# Patient Record
Sex: Female | Born: 1975 | Race: White | Hispanic: No | Marital: Married | Smoking: Current every day smoker
Health system: Southern US, Community
[De-identification: ages and names within clinical notes are randomized; demographics above are authoritative.]

## PROBLEM LIST (undated history)

## (undated) DIAGNOSIS — F319 Bipolar disorder, unspecified: Secondary | ICD-10-CM

## (undated) DIAGNOSIS — F410 Panic disorder [episodic paroxysmal anxiety] without agoraphobia: Secondary | ICD-10-CM

## (undated) DIAGNOSIS — G43909 Migraine, unspecified, not intractable, without status migrainosus: Secondary | ICD-10-CM

## (undated) DIAGNOSIS — R569 Unspecified convulsions: Secondary | ICD-10-CM

## (undated) DIAGNOSIS — F419 Anxiety disorder, unspecified: Secondary | ICD-10-CM

## (undated) HISTORY — PX: TUBAL LIGATION: SHX77

---

## 2011-10-06 ENCOUNTER — Encounter (HOSPITAL_BASED_OUTPATIENT_CLINIC_OR_DEPARTMENT_OTHER): Payer: Self-pay | Admitting: *Deleted

## 2011-10-06 ENCOUNTER — Emergency Department (HOSPITAL_BASED_OUTPATIENT_CLINIC_OR_DEPARTMENT_OTHER): Payer: Self-pay

## 2011-10-06 ENCOUNTER — Emergency Department (HOSPITAL_BASED_OUTPATIENT_CLINIC_OR_DEPARTMENT_OTHER)
Admission: EM | Admit: 2011-10-06 | Discharge: 2011-10-06 | Disposition: A | Discharge status: Home / Self Care | Payer: Self-pay | Attending: Emergency Medicine | Admitting: Emergency Medicine

## 2011-10-06 DIAGNOSIS — J3489 Other specified disorders of nose and nasal sinuses: Secondary | ICD-10-CM | POA: Insufficient documentation

## 2011-10-06 DIAGNOSIS — R220 Localized swelling, mass and lump, head: Secondary | ICD-10-CM | POA: Insufficient documentation

## 2011-10-06 DIAGNOSIS — IMO0002 Reserved for concepts with insufficient information to code with codable children: Secondary | ICD-10-CM | POA: Insufficient documentation

## 2011-10-06 DIAGNOSIS — S02401A Maxillary fracture, unspecified, initial encounter for closed fracture: Secondary | ICD-10-CM | POA: Insufficient documentation

## 2011-10-06 DIAGNOSIS — F17200 Nicotine dependence, unspecified, uncomplicated: Secondary | ICD-10-CM | POA: Insufficient documentation

## 2011-10-06 DIAGNOSIS — S022XXA Fracture of nasal bones, initial encounter for closed fracture: Secondary | ICD-10-CM | POA: Insufficient documentation

## 2011-10-06 MED ORDER — HYDROCODONE-ACETAMINOPHEN 5-325 MG PO TABS: 2.0000 | ORAL_TABLET | ORAL | Status: AC | PRN

## 2011-10-06 MED ORDER — PROMETHAZINE HCL 25 MG PO TABS
25.0000 mg | ORAL_TABLET | Freq: Once | ORAL | Status: AC
  Administered 2011-10-06: 25 mg via ORAL
  Filled 2011-10-06: qty 1

## 2011-10-06 MED ORDER — HYDROCODONE-ACETAMINOPHEN 5-325 MG PO TABS
2.0000 | ORAL_TABLET | Freq: Once | ORAL | Status: AC
  Administered 2011-10-06: 2 via ORAL
  Filled 2011-10-06: qty 2
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
## 2011-10-06 NOTE — ED Provider Notes (Signed)
History     CSN: [id number]  Arrival date & time 10/06/11  1109   First MD Initiated Contact with Patient 10/06/11 1237      Chief Complaint  Patient presents with  . Facial Injury    (Consider location/radiation/quality/duration/timing/severity/associated sxs/prior treatment) Patient is a 36 y.o. female presenting with facial injury. The history is provided by the patient. No language interpreter was used.  Facial Injury  The incident occurred today. The injury mechanism was a direct blow. It is unknown if the wounds were self-inflicted. No protective equipment was used. The pain is moderate. It is unlikely that a foreign body is present. There have been no prior injuries to these areas. Her tetanus status is UTD. There were sick contacts at home.  Pt reports she hit her head on the corner of a table a week ago  No past medical history on file.  No past surgical history on file.  No family history on file.  History  Substance Use Topics  . Smoking status: Current Every Day Smoker  . Smokeless tobacco: Not on file  . Alcohol Use:     OB History    Grav Para Term Preterm Abortions TAB SAB Ect Mult Living                  Review of Systems  HENT: Positive for facial swelling. Negative for nosebleeds.   All other systems reviewed and are negative.    Allergies  Darvocet and Toradol  Home Medications  No current outpatient prescriptions on file.  BP 110/68  Pulse 92  Temp 98.5 F (36.9 C) (Oral)  Resp 20  SpO2 100%  Physical Exam  Nursing note and vitals reviewed. Constitutional: She appears well-developed and well-nourished.  HENT:  Head: Normocephalic and atraumatic.       Tender nose,  Tender left maxillary sinuses  Eyes: Conjunctivae normal and EOM are normal. Pupils are equal, round, and reactive to light.  Neck: Normal range of motion. Neck supple.  Cardiovascular: Normal rate and regular rhythm.   Pulmonary/Chest: Effort normal and breath sounds  normal.  Musculoskeletal: Normal range of motion.  Neurological: She is alert.  Skin: Skin is warm.  Psychiatric: She has a normal mood and affect.    ED Course  Procedures (including critical care time)  Labs Reviewed - No data to display No results found.   1. Nasal bone fracture   2. Maxillary sinus fracture     No results found for this or any previous visit. Ct Maxillofacial Wo Cm  10/06/2011  *RADIOLOGY REPORT*  Clinical Data: Fall with facial injury.  Left periorbital bruising.  CT MAXILLOFACIAL WITHOUT CONTRAST  Technique:  Multidetector CT imaging of the maxillofacial structures was performed. Multiplanar CT image reconstructions were also generated.  Comparison: None.  Findings: Left nasal fractures displaced by 1 mm. Mild impaction of the anterior margin of a 10 mm segment of the left maxillary sinus noted.  The zygomatic arch appears intact, as do the inferior orbital rim, orbital floor, and lateral orbital wall.  The pterygoid plates appear intact.  No mandibular fracture observed.  A localized collection of gas is noted in the fatty tissues between the left buccinator and left masseter muscles.  No discrete intraorbital abnormality observed.  IMPRESSION:  1.  Minimally displaced left nasal fracture. 2.  Segmental impaction of the left anterior maxillary sinus wall. 3.  Localized collection of gas in the fatty tissues between the left buccinator and left masseter muscles.  Original Report Authenticated By: Dellia Cloud, M.D.      MDM  Follow up with Dr. Suszanne Conners next week.   Ice,  Rx for hydrocodone for pain.         Lonia Skinner Coleman, [place] 10/06/11 1421

## 2011-10-06 NOTE — ED Provider Notes (Signed)
Medical screening examination/treatment/procedure(s) were performed by non-physician practitioner and as supervising physician I was immediately available for consultation/collaboration.   Carleene Cooper III, MD 10/06/11 1901

## 2012-05-14 ENCOUNTER — Encounter (HOSPITAL_COMMUNITY): Payer: Self-pay | Admitting: *Deleted

## 2012-05-14 ENCOUNTER — Emergency Department (HOSPITAL_COMMUNITY)
Admission: EM | Admit: 2012-05-14 | Discharge: 2012-05-15 | Disposition: A | Discharge status: Home / Self Care | Payer: Self-pay | Attending: Emergency Medicine | Admitting: Emergency Medicine

## 2012-05-14 DIAGNOSIS — Z8659 Personal history of other mental and behavioral disorders: Secondary | ICD-10-CM | POA: Insufficient documentation

## 2012-05-14 DIAGNOSIS — Z79899 Other long term (current) drug therapy: Secondary | ICD-10-CM | POA: Insufficient documentation

## 2012-05-14 DIAGNOSIS — R112 Nausea with vomiting, unspecified: Secondary | ICD-10-CM | POA: Insufficient documentation

## 2012-05-14 DIAGNOSIS — G43909 Migraine, unspecified, not intractable, without status migrainosus: Secondary | ICD-10-CM | POA: Insufficient documentation

## 2012-05-14 DIAGNOSIS — Z8669 Personal history of other diseases of the nervous system and sense organs: Secondary | ICD-10-CM | POA: Insufficient documentation

## 2012-05-14 DIAGNOSIS — F17200 Nicotine dependence, unspecified, uncomplicated: Secondary | ICD-10-CM | POA: Insufficient documentation

## 2012-05-14 HISTORY — DX: Migraine, unspecified, not intractable, without status migrainosus: G43.909

## 2012-05-14 HISTORY — DX: Anxiety disorder, unspecified: F41.9

## 2012-05-14 HISTORY — DX: Bipolar disorder, unspecified: F31.9

## 2012-05-14 HISTORY — DX: Unspecified convulsions: R56.9

## 2012-05-14 HISTORY — DX: Panic disorder (episodic paroxysmal anxiety): F41.0

## 2012-05-14 MED ORDER — DEXAMETHASONE SODIUM PHOSPHATE 10 MG/ML IJ SOLN
10.0000 mg | Freq: Once | INTRAMUSCULAR | Status: AC
  Administered 2012-05-14: 10 mg via INTRAMUSCULAR
  Filled 2012-05-14: qty 1

## 2012-05-14 MED ORDER — METOCLOPRAMIDE HCL 5 MG/ML IJ SOLN
10.0000 mg | Freq: Once | INTRAMUSCULAR | Status: AC
  Administered 2012-05-14: 10 mg via INTRAMUSCULAR
  Filled 2012-05-14: qty 2

## 2012-05-14 MED ORDER — DIPHENHYDRAMINE HCL 50 MG/ML IJ SOLN
25.0000 mg | Freq: Once | INTRAMUSCULAR | Status: AC
  Administered 2012-05-14: 25 mg via INTRAMUSCULAR
  Filled 2012-05-14: qty 1

## 2012-05-14 MED ORDER — HYDROCODONE-ACETAMINOPHEN 5-325 MG PO TABS
2.0000 | ORAL_TABLET | Freq: Once | ORAL | Status: AC
  Administered 2012-05-14: 2 via ORAL
  Filled 2012-05-14: qty 2

## 2012-05-14 NOTE — ED Notes (Signed)
Pt states has had a migraine since Monday morning, hx of migraines, light and sound sensitivity, nausea and vomited x 1 tonight after dinner.

## 2012-05-14 NOTE — ED Provider Notes (Signed)
History     CSN: [id number]  Arrival date & time 05/14/12  2208   First MD Initiated Contact with Patient 05/14/12 2258      Chief Complaint  Patient presents with  . Migraine    (Consider location/radiation/quality/duration/timing/severity/associated sxs/prior treatment) Patient is a 37 y.o. female presenting with migraines. The history is provided by the patient and medical records. No language interpreter was used.  Migraine This is a new problem. The current episode started in the past 7 days. The problem occurs constantly. The problem has been unchanged. Associated symptoms include headaches, nausea and vomiting. Pertinent negatives include no abdominal pain, anorexia, arthralgias, change in bowel habit, chest pain, chills, congestion, coughing, diaphoresis, fatigue, fever, joint swelling, myalgias, neck pain, numbness, rash, sore throat, swollen glands, urinary symptoms, vertigo, visual change or weakness. The symptoms are aggravated by coughing. She has tried NSAIDs for the symptoms. The treatment provided no relief.    Nera Haworth is a 37 y.o. female  with a hx of migraine, bipolar, anxiety presents to the Emergency Department complaining of gradual, persistent, progressively worsening headache onset 3 days ago.  Pt has tried goodies powder and tylenol without relief. Associated symptoms include nausea and vomiting, facial pain.  Nothing makes it better and light, sound makes it worse.  Pt denies fever, chills, chest pain, SOB, abdominal pain, diarrhea, weakness, dizziness, syncope. Pt also denies sick contacts, nasal congestion, rhinorrhea, sneezing, cough, chest congestion.     Past Medical History  Diagnosis Date  . Migraine   . Bipolar 1 disorder   . Anxiety   . Panic attacks   . Seizures     Past Surgical History  Procedure Laterality Date  . Tubal ligation      History reviewed. No pertinent family history.  History  Substance Use Topics  . Smoking status:  Current Every Day Smoker  . Smokeless tobacco: Never Used  . Alcohol Use: No    OB History   Grav Para Term Preterm Abortions TAB SAB Ect Mult Living                  Review of Systems  Constitutional: Negative for fever, chills, diaphoresis, appetite change, fatigue and unexpected weight change.  HENT: Negative for congestion, sore throat, mouth sores, neck pain and neck stiffness.   Eyes: Negative for visual disturbance.  Respiratory: Negative for cough, chest tightness, shortness of breath and wheezing.   Cardiovascular: Negative for chest pain.  Gastrointestinal: Positive for nausea and vomiting. Negative for abdominal pain, diarrhea, constipation, anorexia and change in bowel habit.  Endocrine: Negative for polydipsia, polyphagia and polyuria.  Genitourinary: Negative for dysuria, urgency, frequency and hematuria.  Musculoskeletal: Negative for myalgias, back pain, joint swelling and arthralgias.  Skin: Negative for rash.  Allergic/Immunologic: Negative for immunocompromised state.  Neurological: Positive for headaches. Negative for vertigo, syncope, weakness, light-headedness and numbness.  Hematological: Does not bruise/bleed easily.  Psychiatric/Behavioral: Negative for sleep disturbance. The patient is not nervous/anxious.     Allergies  Darvocet and Toradol  Home Medications   Current Outpatient Rx  Name  Route  Sig  Dispense  Refill  . Aspirin-Acetaminophen-Caffeine (GOODY HEADACHE PO)   Oral   Take 1 packet by mouth daily as needed. Migraines           BP 119/65  Pulse 55  Temp(Src) 98.1 F (36.7 C) (Oral)  Resp 16  Ht 5\' 4"  (1.626 m)  Wt 120 lb (54.432 kg)  BMI 20.59 kg/m2  SpO2  98%  LMP 04/29/2012  Physical Exam  Nursing note and vitals reviewed. Constitutional: She is oriented to person, place, and time. She appears well-developed and well-nourished. No distress.  HENT:  Head: Normocephalic and atraumatic.  Mouth/Throat: Oropharynx is clear  and moist.  Eyes: Conjunctivae and EOM are normal. Pupils are equal, round, and reactive to light. No scleral icterus.  Neck: Normal range of motion. Neck supple.  Cardiovascular: Normal rate, regular rhythm, normal heart sounds and intact distal pulses.   No murmur heard. Pulmonary/Chest: Effort normal and breath sounds normal. No respiratory distress. She has no wheezes. She has no rales.  Abdominal: Soft. Bowel sounds are normal. There is no tenderness. There is no rebound and no guarding.  Musculoskeletal: Normal range of motion.  Lymphadenopathy:    She has no cervical adenopathy.  Neurological: She is alert and oriented to person, place, and time. She has normal reflexes. No cranial nerve deficit. She exhibits normal muscle tone. Coordination normal.  Speech is clear and goal oriented, follows commands Cranial nerves III - XII without deficit, no facial droop Normal strength in upper and lower extremities bilaterally, strong and equal grip strength Sensation normal to light and sharp touch Moves extremities without ataxia, coordination intact Normal finger to nose and rapid alternating movements Neg romberg, no pronator drift Normal gait Normal heel-shin and balance   Skin: Skin is warm and dry. No rash noted. She is not diaphoretic.  Psychiatric: She has a normal mood and affect. Her behavior is normal. Judgment and thought content normal.    ED Course  Procedures (including critical care time)  Labs Reviewed - No data to display No results found.   1. Migraine       MDM  [name] presents with headache and hx of migraine.  Pt HA treated and improved while in ED.  Presentation is like pts typical HA and non concerning for [facility], ICH, Meningitis, or temporal arteritis. Pt is afebrile with no focal neuro deficits, nuchal rigidity, or change in vision. Pt is to follow up with PCP to discuss prophylactic medication. Pt verbalizes understanding and is agreeable with  plan to dc.          Dahlia Client Aashka Salomone, PA-C 05/15/12 0045

## 2012-05-15 NOTE — ED Provider Notes (Signed)
  Medical screening examination/treatment/procedure(s) were performed by non-physician practitioner and as supervising physician I was immediately available for consultation/collaboration.   Ryun Velez, MD 05/15/12 1928 

## 2012-11-18 ENCOUNTER — Encounter (HOSPITAL_COMMUNITY): Payer: Self-pay | Admitting: Emergency Medicine

## 2012-11-18 ENCOUNTER — Emergency Department (HOSPITAL_COMMUNITY)
Admission: EM | Admit: 2012-11-18 | Discharge: 2012-11-18 | Discharge status: Against Medical Advice | Payer: Self-pay | Attending: Emergency Medicine | Admitting: Emergency Medicine

## 2012-11-18 DIAGNOSIS — G43909 Migraine, unspecified, not intractable, without status migrainosus: Secondary | ICD-10-CM | POA: Insufficient documentation

## 2012-11-18 DIAGNOSIS — F319 Bipolar disorder, unspecified: Secondary | ICD-10-CM | POA: Insufficient documentation

## 2012-11-18 DIAGNOSIS — Z8669 Personal history of other diseases of the nervous system and sense organs: Secondary | ICD-10-CM | POA: Insufficient documentation

## 2012-11-18 DIAGNOSIS — F17200 Nicotine dependence, unspecified, uncomplicated: Secondary | ICD-10-CM | POA: Insufficient documentation

## 2012-11-18 DIAGNOSIS — F410 Panic disorder [episodic paroxysmal anxiety] without agoraphobia: Secondary | ICD-10-CM | POA: Insufficient documentation

## 2012-11-18 DIAGNOSIS — R209 Unspecified disturbances of skin sensation: Secondary | ICD-10-CM | POA: Insufficient documentation

## 2012-11-18 DIAGNOSIS — Z3202 Encounter for pregnancy test, result negative: Secondary | ICD-10-CM | POA: Insufficient documentation

## 2012-11-18 DIAGNOSIS — R064 Hyperventilation: Secondary | ICD-10-CM | POA: Insufficient documentation

## 2012-11-18 DIAGNOSIS — R000 Tachycardia, unspecified: Secondary | ICD-10-CM | POA: Insufficient documentation

## 2012-11-18 LAB — COMPREHENSIVE METABOLIC PANEL
ALT: 9 U/L (ref 0–35)
AST: 18 U/L (ref 0–37)
CO2: 21 mEq/L (ref 19–32)
Chloride: 109 mEq/L (ref 96–112)
GFR calc non Af Amer: >90 mL/min (ref >90)
Sodium: 140 mEq/L (ref 135–145)
Total Bilirubin: 0.2 mg/dL — ABNORMAL LOW (ref 0.3–1.2)

## 2012-11-18 LAB — URINALYSIS, ROUTINE W REFLEX MICROSCOPIC
Bilirubin Urine: NEGATIVE
Glucose, UA: NEGATIVE mg/dL
Ketones, ur: NEGATIVE mg/dL
Leukocytes, UA: NEGATIVE
Protein, ur: NEGATIVE mg/dL

## 2012-11-18 LAB — RAPID URINE DRUG SCREEN, HOSP PERFORMED
Amphetamines: NOT DETECTED
Barbiturates: NOT DETECTED
Benzodiazepines: NOT DETECTED

## 2012-11-18 LAB — CBC
Platelets: 239 10*3/uL (ref 150–400)
RBC: 4.14 MIL/uL (ref 3.87–5.11)
WBC: 9.4 10*3/uL (ref 4.0–10.5)

## 2012-11-18 LAB — ACETAMINOPHEN LEVEL: Acetaminophen (Tylenol), Serum: <15 ug/mL (ref 10–30)

## 2012-11-18 MED ORDER — ALPRAZOLAM 0.5 MG PO TABS
0.5000 mg | ORAL_TABLET | Freq: Once | ORAL | Status: AC
  Administered 2012-11-18: 0.5 mg via ORAL
  Filled 2012-11-18: qty 1

## 2012-11-18 MED ORDER — NICOTINE 14 MG/24HR TD PT24
14.0000 mg | MEDICATED_PATCH | Freq: Once | TRANSDERMAL | Status: DC
  Administered 2012-11-18: 14 mg via TRANSDERMAL
  Filled 2012-11-18: qty 1

## 2012-11-18 NOTE — Progress Notes (Signed)
[facility] provided pt with a [employer] Orange Card application, highlighting [facility].  °

## 2012-11-18 NOTE — ED Notes (Signed)
Pt expressed feelings to leave because she was not getting help at the moment. Talked to TTS about pt and told pt they would be in to talk to her in about 30 mins. Pt did not want to wait for team to come in and pt left AMA. No items left in Rm and pt not in dept.

## 2012-11-18 NOTE — ED Notes (Signed)
Brought in by EMS from home with c/o "manic attack". Pt called EMS and reported that she feels she is having a panic or manic attack--- reports that she has Bipolar Disorder and she feels that she can not "calm herself down and needs to be evaluated".  Pt reports that she is off her psych medication for "3 years".
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
## 2012-11-18 NOTE — ED Notes (Signed)
Bed: HY86 Expected date:  Expected time:  Means of arrival:  Comments: EMS/hyperventilating/manic

## 2012-11-18 NOTE — ED Notes (Signed)
TTS (Assessment) was notified at this time.  Pt was informed that TTS staff will see her on 1st shift, per TTS.

## 2012-11-19 NOTE — ED Provider Notes (Signed)
Medical screening examination/treatment/procedure(s) were performed by non-physician practitioner and as supervising physician I was immediately available for consultation/collaboration.    Olivia Mackie, MD 11/19/12 [phone]

## 2013-02-28 ENCOUNTER — Emergency Department (HOSPITAL_COMMUNITY)
Admission: EM | Admit: 2013-02-28 | Discharge: 2013-02-28 | Disposition: A | Discharge status: Home / Self Care | Payer: Self-pay | Attending: Emergency Medicine | Admitting: Emergency Medicine

## 2013-02-28 ENCOUNTER — Encounter (HOSPITAL_COMMUNITY): Payer: Self-pay | Admitting: Emergency Medicine

## 2013-02-28 ENCOUNTER — Emergency Department (HOSPITAL_COMMUNITY): Payer: Self-pay

## 2013-02-28 DIAGNOSIS — S060XAA Concussion with loss of consciousness status unknown, initial encounter: Secondary | ICD-10-CM

## 2013-02-28 DIAGNOSIS — S060X9A Concussion with loss of consciousness of unspecified duration, initial encounter: Secondary | ICD-10-CM

## 2013-02-28 DIAGNOSIS — S060X0A Concussion without loss of consciousness, initial encounter: Secondary | ICD-10-CM | POA: Insufficient documentation

## 2013-02-28 DIAGNOSIS — Z8669 Personal history of other diseases of the nervous system and sense organs: Secondary | ICD-10-CM | POA: Insufficient documentation

## 2013-02-28 DIAGNOSIS — Z8679 Personal history of other diseases of the circulatory system: Secondary | ICD-10-CM | POA: Insufficient documentation

## 2013-02-28 DIAGNOSIS — F17200 Nicotine dependence, unspecified, uncomplicated: Secondary | ICD-10-CM | POA: Insufficient documentation

## 2013-02-28 DIAGNOSIS — Z8659 Personal history of other mental and behavioral disorders: Secondary | ICD-10-CM | POA: Insufficient documentation

## 2013-02-28 MED ORDER — ONDANSETRON 4 MG PO TBDP
4.0000 mg | ORAL_TABLET | Freq: Once | ORAL | Status: AC
  Administered 2013-02-28: 4 mg via ORAL
  Filled 2013-02-28: qty 1

## 2013-02-28 MED ORDER — HYDROCODONE-ACETAMINOPHEN 5-325 MG PO TABS
1.0000 | ORAL_TABLET | Freq: Once | ORAL | Status: AC
  Administered 2013-02-28: 1 via ORAL
  Filled 2013-02-28: qty 1

## 2013-02-28 MED ORDER — HYDROCODONE-ACETAMINOPHEN 5-325 MG PO TABS: 1.0000 | ORAL_TABLET | ORAL | Status: DC | PRN
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
## 2013-02-28 NOTE — Discharge Instructions (Signed)
Head Injury, Adult  You have received a head injury. It does not appear serious at this time. Headaches and vomiting are common following head injury. It should be easy to awaken from sleeping. Sometimes it is necessary for you to stay in the emergency department for a while for observation. Sometimes admission to the hospital may be needed. After injuries such as yours, most problems occur within the first 24 hours, but side effects may occur up to 7 10 days after the injury. It is important for you to carefully monitor your condition and contact your health care provider or seek immediate medical care if there is a change in your condition.  WHAT ARE THE TYPES OF HEAD INJURIES?  Head injuries can be as minor as a bump. Some head injuries can be more severe. More severe head injuries include:  · A jarring injury to the brain (concussion).  · A bruise of the brain (contusion). This mean there is bleeding in the brain that can cause swelling.  · A cracked skull (skull fracture).  · Bleeding in the brain that collects, clots, and forms a bump (hematoma).  WHAT CAUSES A HEAD INJURY?  A serious head injury is most likely to happen to someone who is in a car wreck and is not wearing a seat belt. Other causes of major head injuries include bicycle or motorcycle accidents, sports injuries, and falls.  HOW ARE HEAD INJURIES DIAGNOSED?  A complete history of the event leading to the injury and your current symptoms will be helpful in diagnosing head injuries. Many times, pictures of the brain, such as CT or MRI are needed to see the extent of the injury. Often, an overnight hospital stay is necessary for observation.   WHEN SHOULD I SEEK IMMEDIATE MEDICAL CARE?   You should get help right away if:  · You have confusion or drowsiness.  · You feel sick to your stomach (nauseous) or have continued, forceful vomiting.  · You have dizziness or unsteadiness that is getting worse.  · You have severe, continued headaches not  relieved by medicine. Only take over-the-counter or prescription medicines for pain, fever, or discomfort as directed by your health care provider.  · You do not have normal function of the arms or legs or are unable to walk.  · You notice changes in the black spots in the center of the colored part of your eye (pupil).  · You have a clear or bloody fluid coming from your nose or ears.  · You have a loss of vision.  During the next 24 hours after the injury, you must stay with someone who can watch you for the warning signs. This person should contact local emergency services (911 in the [place] if you have seizures, you become unconscious, or you are unable to wake up.  HOW CAN I PREVENT A HEAD INJURY IN THE FUTURE?  The most important factor for preventing major head injuries is avoiding motor vehicle accidents.  To minimize the potential for damage to your head, it is crucial to wear seat belts while riding in motor vehicles. Wearing helmets while bike riding and playing collision sports (like football) is also helpful. Also, avoiding dangerous activities around the house will further help reduce your risk of head injury.   WHEN CAN I RETURN TO NORMAL ACTIVITIES AND ATHLETICS?  You should be reevaluated by your health care provider before returning to these activities. If you have any of the following symptoms, you should not return   to activities or contact sports until 1 week after the symptoms have stopped:  · Persistent headache.  · Dizziness or vertigo.  · Poor attention and concentration.  · Confusion.  · Memory problems.  · Nausea or vomiting.  · Fatigue or tire easily.  · Irritability.  · Intolerant of bright lights or loud noises.  · Anxiety or depression.  · Disturbed sleep.  MAKE SURE YOU:   · Understand these instructions.  · Will watch your condition.  · Will get help right away if you are not doing well or get worse.  Document Released: 12/18/2004 Document Revised: 10/08/2012 Document Reviewed:  08/25/2012  ExitCare® Patient Information ©2014 [employer].

## 2014-09-24 ENCOUNTER — Emergency Department (HOSPITAL_COMMUNITY)
Admission: EM | Admit: 2014-09-24 | Discharge: 2014-09-25 | Disposition: A | Discharge status: Home / Self Care | Payer: Self-pay | Attending: Emergency Medicine | Admitting: Emergency Medicine

## 2014-09-24 ENCOUNTER — Encounter (HOSPITAL_COMMUNITY): Payer: Self-pay | Admitting: Emergency Medicine

## 2014-09-24 DIAGNOSIS — T43222A Poisoning by selective serotonin reuptake inhibitors, intentional self-harm, initial encounter: Secondary | ICD-10-CM

## 2014-09-24 DIAGNOSIS — F1210 Cannabis abuse, uncomplicated: Secondary | ICD-10-CM | POA: Insufficient documentation

## 2014-09-24 DIAGNOSIS — T50901A Poisoning by unspecified drugs, medicaments and biological substances, accidental (unintentional), initial encounter: Secondary | ICD-10-CM | POA: Diagnosis present

## 2014-09-24 DIAGNOSIS — T1491 Suicide attempt: Secondary | ICD-10-CM

## 2014-09-24 DIAGNOSIS — T426X2A Poisoning by other antiepileptic and sedative-hypnotic drugs, intentional self-harm, initial encounter: Secondary | ICD-10-CM

## 2014-09-24 DIAGNOSIS — X58XXXA Exposure to other specified factors, initial encounter: Secondary | ICD-10-CM | POA: Insufficient documentation

## 2014-09-24 DIAGNOSIS — Z3202 Encounter for pregnancy test, result negative: Secondary | ICD-10-CM | POA: Insufficient documentation

## 2014-09-24 DIAGNOSIS — Y9289 Other specified places as the place of occurrence of the external cause: Secondary | ICD-10-CM | POA: Insufficient documentation

## 2014-09-24 DIAGNOSIS — Z720 Tobacco use: Secondary | ICD-10-CM | POA: Insufficient documentation

## 2014-09-24 DIAGNOSIS — T43592A Poisoning by other antipsychotics and neuroleptics, intentional self-harm, initial encounter: Secondary | ICD-10-CM

## 2014-09-24 DIAGNOSIS — G43909 Migraine, unspecified, not intractable, without status migrainosus: Secondary | ICD-10-CM | POA: Insufficient documentation

## 2014-09-24 DIAGNOSIS — F3130 Bipolar disorder, current episode depressed, mild or moderate severity, unspecified: Secondary | ICD-10-CM | POA: Diagnosis present

## 2014-09-24 DIAGNOSIS — Y998 Other external cause status: Secondary | ICD-10-CM | POA: Insufficient documentation

## 2014-09-24 DIAGNOSIS — Y9389 Activity, other specified: Secondary | ICD-10-CM | POA: Insufficient documentation

## 2014-09-24 DIAGNOSIS — F3132 Bipolar disorder, current episode depressed, moderate: Secondary | ICD-10-CM

## 2014-09-24 LAB — RAPID URINE DRUG SCREEN, HOSP PERFORMED
Amphetamines: NOT DETECTED
Barbiturates: NOT DETECTED
Benzodiazepines: NOT DETECTED
Cocaine: NOT DETECTED
OPIATES: NOT DETECTED
TETRAHYDROCANNABINOL: POSITIVE — AB

## 2014-09-24 LAB — COMPREHENSIVE METABOLIC PANEL
ALK PHOS: 92 U/L (ref 38–126)
ALT: 14 U/L (ref 14–54)
AST: 26 U/L (ref 15–41)
Albumin: 3.9 g/dL (ref 3.5–5.0)
Anion gap: 8 (ref 5–15)
BUN: 10 mg/dL (ref 6–20)
CALCIUM: 9.1 mg/dL (ref 8.9–10.3)
CO2: 23 mmol/L (ref 22–32)
CREATININE: 0.9 mg/dL (ref 0.44–1.00)
Chloride: 107 mmol/L (ref 101–111)
Glucose, Bld: 96 mg/dL (ref 65–99)
Potassium: 3.8 mmol/L (ref 3.5–5.1)
Sodium: 138 mmol/L (ref 135–145)
TOTAL PROTEIN: 7.4 g/dL (ref 6.5–8.1)
Total Bilirubin: 0.4 mg/dL (ref 0.3–1.2)

## 2014-09-24 LAB — ACETAMINOPHEN LEVEL: Acetaminophen (Tylenol), Serum: <10 ug/mL — ABNORMAL LOW (ref 10–30)

## 2014-09-24 LAB — CBC
HCT: 34.5 % — ABNORMAL LOW (ref 36.0–46.0)
HEMOGLOBIN: 11.3 g/dL — AB (ref 12.0–15.0)
MCH: 28.6 pg (ref 26.0–34.0)
MCHC: 32.8 g/dL (ref 30.0–36.0)
MCV: 87.3 fL (ref 78.0–100.0)
PLATELETS: 247 10*3/uL (ref 150–400)
RBC: 3.95 MIL/uL (ref 3.87–5.11)
RDW: 14.4 % (ref 11.5–15.5)
WBC: 6.7 10*3/uL (ref 4.0–10.5)

## 2014-09-24 LAB — ETHANOL

## 2014-09-24 LAB — SALICYLATE LEVEL

## 2014-09-24 LAB — CBG MONITORING, ED: Glucose-Capillary: 98 mg/dL (ref 65–99)

## 2014-09-24 LAB — PREGNANCY, URINE: PREG TEST UR: NEGATIVE

## 2014-09-24 MED ORDER — ALUM & MAG HYDROXIDE-SIMETH 200-200-20 MG/5ML PO SUSP: 30.0000 mL | ORAL | Status: DC | PRN

## 2014-09-24 MED ORDER — ONDANSETRON HCL 4 MG PO TABS: 4.0000 mg | ORAL_TABLET | Freq: Three times a day (TID) | ORAL | Status: DC | PRN

## 2014-09-24 MED ORDER — NICOTINE 21 MG/24HR TD PT24
21.0000 mg | MEDICATED_PATCH | Freq: Every day | TRANSDERMAL | Status: DC
  Administered 2014-09-25: 21 mg via TRANSDERMAL
  Filled 2014-09-24: qty 1

## 2014-09-24 MED ORDER — LORAZEPAM 1 MG PO TABS: 1.0000 mg | ORAL_TABLET | Freq: Three times a day (TID) | ORAL | Status: DC | PRN

## 2014-09-24 NOTE — Progress Notes (Signed)
CM spoke with pt who confirms uninsured [employer] resident with no pcp. Pt in [facility] ED with [facility] staff  CM discussed and provided written information for uninsured accepting pcps, discussed the importance of pcp vs EDP services for f/u care, www.needymeds.org, www.goodrx.com, discounted pharmacies and other [employer] such as [facility] , [employer], affordable care act, financial assistance, uninsured dental services, [place] med assist, DSS and  health department  Reviewed resources for [employer] uninsured accepting pcps like [facility], family medicine at [facility], [facility], [facility], local urgent care centers, [facility], [facility] family practice, general medical clinics, family services of the [place], [facility] urgent care plus others, medication resources, [facility] out patient pharmacies and housing Pt voiced understanding and appreciation of resources provided   Provided [facility] contact information Pt did not agreed to a referral at this time Incarcerated sees psychiatrist Dr Joanne Chars

## 2014-09-24 NOTE — ED Notes (Signed)
Pt. Noted sleeping in room. No complaints or concerns voiced. No distress or abnormal behavior noted. Will continue to monitor with security cameras. Q 15 minute rounds continue. 

## 2014-09-24 NOTE — ED Notes (Signed)
Pt. Noted in room. No complaints or concerns voiced. No distress or abnormal behavior noted. Will continue to monitor with security cameras. Q 15 minute rounds continue. 

## 2014-09-24 NOTE — ED Notes (Signed)
Pt oriented to room and unit.  Denies SI, HI and AVH.   Pt reassured of her safety.  Video monitoring and 15 minute checks in place.

## 2014-09-24 NOTE — ED Notes (Signed)
Bed: [facility] Expected date:  Expected time:  Means of arrival:  Comments: Hold for rm 2

## 2014-09-24 NOTE — Consult Note (Signed)
[facility] Face-to-Face Psychiatry Consult   Reason for Consult:  Overdose Referring Physician:  EDP Patient Identification: Kaitlyn Shepherd MRN:  [id number] Principal Diagnosis: Bipolar affective disorder, depressed Diagnosis:   Patient Active Problem List   Diagnosis Date Noted  . Bipolar affective disorder, depressed [F31.30] 09/24/2014    Priority: High  . Overdose [T50.901A] 09/24/2014    Priority: High    Total Time spent with patient: 45 minutes  Subjective:   Katheleen Shepherd is a 39 y.o. female patient admitted with intentional overdose.  HPI:  39 yo female presented to the ED after an overdose on 10 Celexa, 4-5 gabapentin, and 2-300 mg Seroquel this morning.  History of bipolar disorder, patient at [facility].  Patient was upset last night after an altercation with her adult son and mother who do not want anything to do with her because they are accusing her of using "crack".  Patient denies this allegation.  This morning she felt she would "do everyone a favor and leave this world" and overdosed, texted her mother and son with apologies for being a bad mother.  911 was eventually called.  Alizzon lives with her fiance which is going well.  Currently, denies suicidal/homicidal ideations, hallucinations, and drug abuse except marijuana on occasion.  No prior suicide attempts and one psychiatric hospitalization "years ago".  Denies homicidal ideations and hallucinations. HPI Elements:   Location:  generalized. Quality:  acute. Severity:  moderate to severe. Timing:  intermittent. Duration:  since last night. Context:  altercation with family.  Past Medical History:  Past Medical History  Diagnosis Date  . Migraine   . Bipolar 1 disorder   . Anxiety   . Panic attacks   . Seizures     Past Surgical History  Procedure Laterality Date  . Tubal ligation     Family History: History reviewed. No pertinent family history. Social History:  History  Alcohol Use No     History   Drug Use  . Yes  . Special: Marijuana    Social History   Social History  . Marital Status: Legally Separated    Spouse Name: N/A  . Number of Children: N/A  . Years of Education: N/A   Social History Main Topics  . Smoking status: Current Every Day Smoker  . Smokeless tobacco: Never Used  . Alcohol Use: No  . Drug Use: Yes    Special: Marijuana  . Sexual Activity: Not Asked   Other Topics Concern  . None   Social History Narrative   Additional Social History:                          Allergies:   Allergies  Allergen Reactions  . Darvocet [Propoxyphene N-Acetaminophen] Itching  . Toradol [Ketorolac Tromethamine] Rash    Itching     Labs:  Results for orders placed or performed during the hospital encounter of 09/24/14 (from the past 48 hour(s))  CBG monitoring, ED     Status: None   Collection Time: 09/24/14  3:58 PM  Result Value Ref Range   Glucose-Capillary 98 65 - 99 mg/dL    Vitals: Blood pressure 118/66, pulse 89, temperature 98.6 F (37 C), temperature source Oral, resp. rate 18, last menstrual period 01/08/2013, SpO2 96 %.  Risk to Self: Is patient at risk for suicide?: Yes Risk to Others:   Prior Inpatient Therapy:   Prior Outpatient Therapy:    Current Facility-Administered Medications  Medication Dose Route Frequency  Provider Last Rate Last Dose  . alum & mag hydroxide-simeth (MAALOX/MYLANTA) 200-200-20 MG/5ML suspension 30 mL  30 mL Oral PRN Azalia Bilis, MD      . nicotine (NICODERM CQ - dosed in mg/24 hours) patch 21 mg  21 mg Transdermal Daily Azalia Bilis, MD      . ondansetron [facility]) tablet 4 mg  4 mg Oral Q8H PRN Azalia Bilis, MD       Current Outpatient Prescriptions  Medication Sig Dispense Refill  . citalopram (CELEXA) 40 MG tablet Take 40 mg by mouth daily.    Marland Kitchen gabapentin (NEURONTIN) 300 MG capsule Take 300 mg by mouth 2 (two) times daily as needed (pain).    . QUEtiapine (SEROQUEL XR) 300 MG 24 hr tablet Take 300 mg  by mouth at bedtime.    Marland Kitchen HYDROcodone-acetaminophen (NORCO/VICODIN) 5-325 MG per tablet Take 1-2 tablets by mouth every 4 (four) hours as needed. (Patient not taking: Reported on 09/24/2014) 16 tablet 0    Musculoskeletal: Strength & Muscle Tone: within normal limits Gait & Station: normal Patient leans: N/A  Psychiatric Specialty Exam: Physical Exam  Review of Systems  Constitutional: Negative.   HENT: Negative.   Eyes: Negative.   Respiratory: Negative.   Cardiovascular: Negative.   Gastrointestinal: Negative.   Genitourinary: Negative.   Musculoskeletal: Negative.   Skin: Negative.   Neurological: Negative.   Endo/Heme/Allergies: Negative.   Psychiatric/Behavioral: Positive for depression and substance abuse.    Blood pressure 118/66, pulse 89, temperature 98.6 F (37 C), temperature source Oral, resp. rate 18, last menstrual period 01/08/2013, SpO2 96 %.There is no weight on file to calculate BMI.  General Appearance: Disheveled  Eye Solicitor::  Fair  Speech:  Normal Rate  Volume:  Normal  Mood:  Depressed  Affect:  Congruent  Thought Process:  Coherent  Orientation:  Full (Time, Place, and Person)  Thought Content:  WDL  Suicidal Thoughts:  No  Homicidal Thoughts:  No  Memory:  Immediate;   Good Recent;   Good Remote;   Good  Judgement:  Poor  Insight:  Fair  Psychomotor Activity:  Decreased  Concentration:  Fair  Recall:  [employer] of Knowledge:Good  Language: Fair  Akathisia:  No  Handed:  Right  AIMS (if indicated):     Assets:  Housing Intimacy Leisure Time Physical Health Resilience Social Support  ADL's:  Intact  Cognition: WNL  Sleep:      Medical Decision Making: Review of Psycho-Social Stressors (1), Review or order clinical lab tests (1) and Review of Medication Regimen & Side Effects (2)  Treatment Plan Summary: Daily contact with patient to assess and evaluate symptoms and progress in treatment, Medication management and Plan bipolar  affective disorder most recent episode depressed, moderate: -Crisis stabilization -Medication management:  Wait to restart home medications due to overdose -Individual counseling  Plan:  Recommend psychiatric Inpatient admission when medically cleared. Disposition: Admit to inpatient hospitalization for stabilization  Nanine Means, PMH-NP 09/24/2014 4:34 PM Patient seen face-to-face for psychiatric evaluation, chart reviewed and case discussed with the physician extender and developed treatment plan. Reviewed the information documented and agree with the treatment plan. Thedore Mins, MD

## 2014-09-24 NOTE — ED Notes (Signed)
Report received from Eddie RN. Pt. Sleeping, respirations regular and unlabored.  Will continue to monitor for safety via security cameras and Q 15 minute checks.  

## 2014-09-24 NOTE — ED Provider Notes (Signed)
CSN: [id number]     Arrival date & time 09/24/14  1538 History   First MD Initiated Contact with Patient 09/24/14 1550     Chief Complaint  Patient presents with  . IVC    . Suicidal  . Drug Overdose     HPI Patient reports intentional ingestion at 12:15 PM today.  She reports that she ingested celexa x 5, seroquel  x 2,and  neurontin  x 5.  She reports she did this in attempt to kill her self.  Right now her only complaint is feeling "sleepy".  Denies chest pain shortness breath.  Denies ibuprofen or Tylenol use.  Reports increasing issues between multiple family members which is what drove her to these thoughts today.  She is normally compliant with her medications and is followed by a psychiatrist at [facility].  She has a history of bipolar disorder.    Past Medical History  Diagnosis Date  . Migraine   . Bipolar 1 disorder   . Anxiety   . Panic attacks   . Seizures    Past Surgical History  Procedure Laterality Date  . Tubal ligation     History reviewed. No pertinent family history. Social History  Substance Use Topics  . Smoking status: Current Every Day Smoker  . Smokeless tobacco: Never Used  . Alcohol Use: No   OB History    No data available     Review of Systems  All other systems reviewed and are negative.     Allergies  Darvocet and Toradol  Home Medications   Prior to Admission medications   Medication Sig Start Date End Date Taking? Authorizing Provider  Aspirin-Acetaminophen-Caffeine (GOODY HEADACHE PO) Take 2 packets by mouth 4 (four) times daily as needed (pain).     Historical Provider, MD  HYDROcodone-acetaminophen (NORCO/VICODIN) 5-325 MG per tablet Take 1-2 tablets by mouth every 4 (four) hours as needed. 02/28/13   Linwood Dibbles, MD   BP 109/69 mmHg  Pulse 95  Temp(Src) 99 F (37.2 C) (Oral)  Resp 20  SpO2 98%  LMP 01/08/2013 Physical Exam  Constitutional: She is oriented to person, place, and time. She appears well-developed and  well-nourished. No distress.  HENT:  Head: Normocephalic and atraumatic.  Eyes: EOM are normal.  Neck: Normal range of motion.  Cardiovascular: Normal rate, regular rhythm and normal heart sounds.   Pulmonary/Chest: Effort normal and breath sounds normal.  Abdominal: Soft. She exhibits no distension. There is no tenderness.  Musculoskeletal: Normal range of motion.  Neurological: She is alert and oriented to person, place, and time.  Skin: Skin is warm and dry.  Psychiatric: She has a normal mood and affect. Judgment normal.  Nursing note and vitals reviewed.   ED Course  Procedures (including critical care time) Labs Review Labs Reviewed  COMPREHENSIVE METABOLIC PANEL  ETHANOL  SALICYLATE LEVEL  ACETAMINOPHEN LEVEL  CBC  URINE RAPID DRUG SCREEN, HOSP PERFORMED  CBG MONITORING, ED    Imaging Review No results found. I have personally reviewed and evaluated these images and lab results as part of my medical decision-making.   EKG Interpretation   Date/Time:  Friday September 24 2014 16:08:11 EDT Ventricular Rate:  83 PR Interval:  135 QRS Duration: 96 QT Interval:  413 QTC Calculation: 485 R Axis:   102 Text Interpretation:  Sinus rhythm Probable lateral infarct, old No old  tracing to compare Confirmed by CAMPOS  MD, KEVIN ([id number]) on 09/24/2014  4:15:40 PM  MDM   Final diagnoses:  None   No life-threatening overdose.  Patient is medically clear at this time.  TTS to evaluate.    Azalia Bilis, MD 09/24/14 [phone]

## 2014-09-24 NOTE — ED Notes (Signed)
Pt brought in by [facility] paperwork in process. Reports taking "at least more than 5 Celexa, four to five 300 mg Gabapentin, two 300 mg Seroquel today around 1215" with the intention of self harm. Adamantly denies SI/HI at this time. Says, "I've been accused of smoking crack by my son. My family has turned their backs on me. I tried to see my son last night but I'm not allowed to be on my mother's property." Denies hallucinations. Pt appears very sleepy but is still able to answer all questions appropriately. No other c/c.

## 2014-09-25 DIAGNOSIS — F3132 Bipolar disorder, current episode depressed, moderate: Secondary | ICD-10-CM | POA: Insufficient documentation

## 2014-09-25 NOTE — ED Notes (Signed)
Pt. Requesting nicotine patch.

## 2014-09-25 NOTE — ED Notes (Signed)
Pt. Noted sleeping in room. No complaints or concerns voiced. No distress or abnormal behavior noted. Will continue to monitor with security cameras. Q 15 minute rounds continue. 

## 2014-09-25 NOTE — ED Notes (Signed)
Pt is aware that she is being dc'd.  Pt denies si/hi/avh at this time and reports that she feels safe to go home.  Up to the phone to call for a ride home.

## 2014-09-25 NOTE — Progress Notes (Signed)
10:31am. CSW called pt's finace, Kaitlyn Shepherd, at [phone]. Kaitlyn Shepherd reports that last night they met with pt's mother and son, and mother son and pt got into a verbal altercation. Kaitlyn Shepherd and pt are in the process of moving to [place] from [place], and Kaitlyn Shepherd reports that pt was hoping to have a good relationship with her mother and be able to see her more. Thus, when mother "basically said she doesn't want anything to do with [pt]," on top of stress of moving, pt became very upset and took overdose of her prescribed medicine. Kaitlyn Shepherd states that he loves pt and has been with her for 3 years, so he knows her triggers pretty well and knows how to help her. Kaitlyn Shepherd reports pt adheres to her medication regimen. Kaitlyn Shepherd reports that pt goes to [facility] in [place], and when she previously lived in [place] she also used [facility] for mental health services. Pt planning on transferring care back to [place]. Kaitlyn Shepherd states only long standing complaint is that pt oversleeps (10-12 hrs/day) and stays up late until about 1am and then wakes up as late as 1pm.   Kaitlyn Shepherd in agreement with discharge decision. CSW shared this information with psych team.  [facility] phone: [phone]

## 2014-09-25 NOTE — ED Notes (Addendum)
Nad, resting quietly, asking about her dc status.  Will verify w/ tts
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
## 2014-09-25 NOTE — [facility] Suicide Risk Assessment (Signed)
Suicide Risk Assessment  Discharge Assessment   [facility] Discharge Suicide Risk Assessment   Demographic Factors:  Caucasian  Total Time spent with patient: 30 minutes    Musculoskeletal: Strength & Muscle Tone: within normal limits Gait & Station: normal Patient leans: N/A  Psychiatric Specialty Exam: Physical Exam  Review of Systems  Constitutional: Negative.   HENT: Negative.   Eyes: Negative.   Respiratory: Negative.   Cardiovascular: Negative.   Gastrointestinal: Negative.   Genitourinary: Negative.   Musculoskeletal: Negative.   Skin: Negative.   Neurological: Negative.   Endo/Heme/Allergies: Negative.   Psychiatric/Behavioral: Positive for substance abuse.    Blood pressure 101/59, pulse 66, temperature 98.6 F (37 C), temperature source Oral, resp. rate 18, last menstrual period 01/08/2013, SpO2 95 %.There is no weight on file to calculate BMI.  General Appearance: Casual  Eye Contact::  Good  Speech:  Normal Rate  Volume:  Normal  Mood:  Euthymic  Affect:  Congruent  Thought Process:  Coherent  Orientation:  Full (Time, Place, and Person)  Thought Content:  WDL  Suicidal Thoughts:  No  Homicidal Thoughts:  No  Memory:  Immediate;   Good Recent;   Good Remote;   Good  Judgement:  Fair  Insight:  Good  Psychomotor Activity:  Normal  Concentration:  Good  Recall:  Good  Fund of Knowledge:Good  Language: Good  Akathisia:  No  Handed:  Right  AIMS (if indicated):     Assets:  Housing Intimacy Leisure Time Physical Health Resilience Social Support  ADL's:  Intact  Cognition: WNL  Sleep:      Has this patient used any form of tobacco in the last 30 days? (Cigarettes, Smokeless Tobacco, Cigars, and/or Pipes) Yes, A prescription for an FDA-approved tobacco cessation medication was offered at discharge and the patient refused  Mental Status Per Nursing Assessment::   On Admission:   Overdose  Current Mental Status by Physician: NA  Loss  Factors: NA  Historical Factors: NA  Risk Reduction Factors:   Sense of responsibility to family, Living with another person, especially a relative, Positive social support and Positive therapeutic relationship  Continued Clinical Symptoms:  NOne  Cognitive Features That Contribute To Risk:  None    Suicide Risk:  Minimal: No identifiable suicidal ideation.  Patients presenting with no risk factors but with morbid ruminations; may be classified as minimal risk based on the severity of the depressive symptoms  Principal Problem: Bipolar affective disorder, depressed Discharge Diagnoses:  Patient Active Problem List   Diagnosis Date Noted  . Bipolar affective disorder, depressed [F31.30] 09/24/2014    Priority: High  . Overdose [T50.901A] 09/24/2014    Priority: High  . Bipolar affective disorder, currently depressed, moderate [F31.32]     Follow-up Information    Follow up with Please use the resources provided to you in emergency room by case manager to assist with doctor for follow up . Schedule an appointment as soon as possible for a visit on 09/27/2014.   Contact information:   These [place] uninsured resources provide possible primary care providers, resources for discounted medications, housing, dental resources, affordable care act information, plus other resources for [employer] Of Care/Follow-up recommendations:  Activity:  as tolerated Diet:  heart healthy diet  Is patient on multiple antipsychotic therapies at discharge:  No   Has Patient had three or more failed trials of antipsychotic monotherapy by history:  No  Recommended Plan for  Multiple Antipsychotic Therapies: NA    Steward Sames, PMH-NP 09/25/2014, 10:39 AM

## 2014-09-25 NOTE — ED Notes (Addendum)
Written dc instructions reviewed w/ pt.  Pt denies si/hi/avh at this time and reports that she is safe to leave.  Pt encouraged to follow up with her MD and take her medications as directed (starting tomorrow). Pt encouraged to seek treatment for any suicidal thoughts/urges.  Pt ambulatory w/o difficulty to dc window w/ mHt, belongings returned after leaving the unit.

## 2014-09-25 NOTE — ED Notes (Signed)
Pt. Noted in room. No complaints or concerns voiced. No distress or abnormal behavior noted. Will continue to monitor with security cameras. Q 15 minute rounds continue. 

## 2014-09-25 NOTE — ED Notes (Addendum)
IVC has been rescinded per np/tts and is OK to dc home

## 2014-09-25 NOTE — ED Notes (Signed)
Dr a and jamison dnp into see 

## 2014-12-19 ENCOUNTER — Emergency Department (HOSPITAL_COMMUNITY)
Admission: EM | Admit: 2014-12-19 | Discharge: 2014-12-19 | Disposition: A | Discharge status: Home / Self Care | Payer: Self-pay

## 2014-12-19 ENCOUNTER — Encounter (HOSPITAL_COMMUNITY): Payer: Self-pay | Admitting: Oncology

## 2014-12-19 DIAGNOSIS — F1010 Alcohol abuse, uncomplicated: Secondary | ICD-10-CM | POA: Diagnosis present

## 2014-12-19 DIAGNOSIS — R451 Restlessness and agitation: Secondary | ICD-10-CM | POA: Insufficient documentation

## 2014-12-19 DIAGNOSIS — R45851 Suicidal ideations: Secondary | ICD-10-CM | POA: Insufficient documentation

## 2014-12-19 DIAGNOSIS — F410 Panic disorder [episodic paroxysmal anxiety] without agoraphobia: Secondary | ICD-10-CM | POA: Insufficient documentation

## 2014-12-19 DIAGNOSIS — S50311A Abrasion of right elbow, initial encounter: Secondary | ICD-10-CM | POA: Insufficient documentation

## 2014-12-19 DIAGNOSIS — F10929 Alcohol use, unspecified with intoxication, unspecified: Secondary | ICD-10-CM | POA: Insufficient documentation

## 2014-12-19 DIAGNOSIS — F319 Bipolar disorder, unspecified: Secondary | ICD-10-CM | POA: Insufficient documentation

## 2014-12-19 DIAGNOSIS — F10920 Alcohol use, unspecified with intoxication, uncomplicated: Secondary | ICD-10-CM

## 2014-12-19 DIAGNOSIS — F1410 Cocaine abuse, uncomplicated: Secondary | ICD-10-CM | POA: Diagnosis present

## 2014-12-19 DIAGNOSIS — F1210 Cannabis abuse, uncomplicated: Secondary | ICD-10-CM | POA: Insufficient documentation

## 2014-12-19 DIAGNOSIS — Y9289 Other specified places as the place of occurrence of the external cause: Secondary | ICD-10-CM | POA: Insufficient documentation

## 2014-12-19 DIAGNOSIS — Z79899 Other long term (current) drug therapy: Secondary | ICD-10-CM | POA: Insufficient documentation

## 2014-12-19 DIAGNOSIS — Y998 Other external cause status: Secondary | ICD-10-CM | POA: Insufficient documentation

## 2014-12-19 DIAGNOSIS — Y9389 Activity, other specified: Secondary | ICD-10-CM | POA: Insufficient documentation

## 2014-12-19 DIAGNOSIS — Z23 Encounter for immunization: Secondary | ICD-10-CM | POA: Insufficient documentation

## 2014-12-19 DIAGNOSIS — F17200 Nicotine dependence, unspecified, uncomplicated: Secondary | ICD-10-CM | POA: Insufficient documentation

## 2014-12-19 DIAGNOSIS — F1014 Alcohol abuse with alcohol-induced mood disorder: Secondary | ICD-10-CM | POA: Insufficient documentation

## 2014-12-19 DIAGNOSIS — Z3202 Encounter for pregnancy test, result negative: Secondary | ICD-10-CM | POA: Insufficient documentation

## 2014-12-19 DIAGNOSIS — F10120 Alcohol abuse with intoxication, uncomplicated: Secondary | ICD-10-CM | POA: Insufficient documentation

## 2014-12-19 DIAGNOSIS — F1094 Alcohol use, unspecified with alcohol-induced mood disorder: Secondary | ICD-10-CM | POA: Diagnosis present

## 2014-12-19 DIAGNOSIS — G43909 Migraine, unspecified, not intractable, without status migrainosus: Secondary | ICD-10-CM | POA: Insufficient documentation

## 2014-12-19 LAB — COMPREHENSIVE METABOLIC PANEL
ALT: 13 U/L — AB (ref 14–54)
AST: 25 U/L (ref 15–41)
Albumin: 4.4 g/dL (ref 3.5–5.0)
Alkaline Phosphatase: 83 U/L (ref 38–126)
Anion gap: 8 (ref 5–15)
CHLORIDE: 102 mmol/L (ref 101–111)
CO2: 24 mmol/L (ref 22–32)
CREATININE: 0.59 mg/dL (ref 0.44–1.00)
Calcium: 9.1 mg/dL (ref 8.9–10.3)
GFR calc Af Amer: >60 mL/min (ref >60)
GLUCOSE: 118 mg/dL — AB (ref 65–99)
Potassium: 3.7 mmol/L (ref 3.5–5.1)
Sodium: 134 mmol/L — ABNORMAL LOW (ref 135–145)
Total Bilirubin: 0.3 mg/dL (ref 0.3–1.2)
Total Protein: 7.4 g/dL (ref 6.5–8.1)

## 2014-12-19 LAB — CBC WITH DIFFERENTIAL/PLATELET
Basophils Absolute: 0 10*3/uL (ref 0.0–0.1)
Basophils Relative: 0 %
EOS PCT: 0 %
Eosinophils Absolute: 0 10*3/uL (ref 0.0–0.7)
HCT: 38.7 % (ref 36.0–46.0)
Hemoglobin: 12.9 g/dL (ref 12.0–15.0)
LYMPHS PCT: 29 %
Lymphs Abs: 2.8 10*3/uL (ref 0.7–4.0)
MCH: 28.7 pg (ref 26.0–34.0)
MCHC: 33.3 g/dL (ref 30.0–36.0)
MCV: 86 fL (ref 78.0–100.0)
MONO ABS: 0.6 10*3/uL (ref 0.1–1.0)
MONOS PCT: 6 %
Neutro Abs: 6.4 10*3/uL (ref 1.7–7.7)
Neutrophils Relative %: 65 %
PLATELETS: 284 10*3/uL (ref 150–400)
RBC: 4.5 MIL/uL (ref 3.87–5.11)
RDW: 15.1 % (ref 11.5–15.5)
WBC: 9.9 10*3/uL (ref 4.0–10.5)

## 2014-12-19 LAB — RAPID URINE DRUG SCREEN, HOSP PERFORMED
AMPHETAMINES: NOT DETECTED
Barbiturates: NOT DETECTED
Benzodiazepines: NOT DETECTED
Cocaine: POSITIVE — AB
Opiates: NOT DETECTED
Tetrahydrocannabinol: POSITIVE — AB

## 2014-12-19 LAB — PREGNANCY, URINE: PREG TEST UR: NEGATIVE

## 2014-12-19 LAB — ETHANOL: ALCOHOL ETHYL (B): 133 mg/dL — AB (ref <5)

## 2014-12-19 MED ORDER — ONDANSETRON HCL 4 MG PO TABS: 4.0000 mg | ORAL_TABLET | Freq: Three times a day (TID) | ORAL | Status: DC | PRN

## 2014-12-19 MED ORDER — ALPRAZOLAM 0.5 MG PO TABS
0.5000 mg | ORAL_TABLET | Freq: Once | ORAL | Status: AC
  Administered 2014-12-19: 0.5 mg via ORAL
  Filled 2014-12-19: qty 1

## 2014-12-19 MED ORDER — NICOTINE 21 MG/24HR TD PT24
21.0000 mg | MEDICATED_PATCH | Freq: Every day | TRANSDERMAL | Status: DC
  Administered 2014-12-19: 21 mg via TRANSDERMAL
  Filled 2014-12-19: qty 1

## 2014-12-19 MED ORDER — TETANUS-DIPHTH-ACELL PERTUSSIS 5-2.5-18.5 LF-MCG/0.5 IM SUSP
0.5000 mL | Freq: Once | INTRAMUSCULAR | Status: AC
  Administered 2014-12-19: 0.5 mL via INTRAMUSCULAR
  Filled 2014-12-19: qty 0.5

## 2014-12-19 MED ORDER — LORAZEPAM 1 MG PO TABS: 1.0000 mg | ORAL_TABLET | Freq: Three times a day (TID) | ORAL | Status: DC | PRN
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
## 2014-12-19 NOTE — Discharge Instructions (Signed)
Please follow up with [facility] or [facility]  for [facility] Health Care needs.  Contact [facility] Mobile Crisis Management if you are in crisis. Mobile Crisis provides response 24 hours a day, 7 days a week, 365 days a year.  [facility] ACCESS LINE:  [phone] or [phone] [facility] [address] [place], [place] [address] [email or web address]   Mobile Crisis Teams [facility]  Mobile Crisis Care Unit  [phone]    [facility]  [facility] Call for location of [facility] office  [address] [phone]      [place] [address]        [phone]

## 2014-12-19 NOTE — ED Notes (Signed)
Bed: WBH37 Expected date:  Expected time:  Means of arrival:  Comments: t4 

## 2014-12-19 NOTE — [facility] Suicide Risk Assessment (Signed)
Suicide Risk Assessment  Discharge Assessment   [facility]   Demographic Factors:  Caucasian  Total Time spent with patient: 45 minutes  Musculoskeletal: Strength & Muscle Tone: within normal limits Gait & Station: normal Patient leans: N/A  Psychiatric Specialty Exam: Review of Systems  Constitutional: Negative.   HENT: Negative.   Eyes: Negative.   Respiratory: Negative.   Cardiovascular: Negative.   Gastrointestinal: Negative.   Genitourinary: Negative.   Musculoskeletal: Negative.   Skin: Negative.   Neurological: Negative.   Endo/Heme/Allergies: Negative.   Psychiatric/Behavioral: Positive for substance abuse.    Blood pressure 107/83, pulse 72, temperature 97.6 F (36.4 C), temperature source Oral, resp. rate 16, height 5\' 4"  (1.626 m), weight 59.421 kg (131 lb), last menstrual period 01/08/2013, SpO2 98 %.Body mass index is 22.47 kg/(m^2).  General Appearance: Disheveled  Eye Contact::  Good  Speech:  Normal Rate  Volume:  Normal  Mood:  Anxious, mild  Affect:  Congruent  Thought Process:  Coherent  Orientation:  Full (Time, Place, and Person)  Thought Content:  WDL  Suicidal Thoughts:  No  Homicidal Thoughts:  No  Memory:  Immediate;   Good Recent;   Good Remote;   Good  Judgement:  Fair  Insight:  Fair  Psychomotor Activity:  Normal  Concentration:  Good  Recall:  Good  Fund of Knowledge:Good  Language: Good  Akathisia:  No  Handed:  Right  AIMS (if indicated):     Assets:  Housing Intimacy Leisure Time Physical Health Resilience Social Support  ADL's:  Intact  Cognition: WNL  Sleep:          Has this patient used any form of tobacco in the last 30 days? (Cigarettes, Smokeless Tobacco, Cigars, and/or Pipes) Yes, A prescription for an FDA-approved tobacco cessation medication was offered at discharge and the patient refused  Mental Status Per Nursing Assessment::   On Admission:   Substance abuse, suicidal  comment  Current Mental Status by Physician: NA  Loss Factors: NA  Historical Factors: Impulsivity  Risk Reduction Factors:   Sense of responsibility to family, Living with another person, especially a relative, Positive social support and Positive coping skills or problem solving skills  Continued Clinical Symptoms:  Anxiety, mild  Cognitive Features That Contribute To Risk:  None    Suicide Risk:  Minimal: No identifiable suicidal ideation.  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 Cocaine abuse [F14.10] 12/19/2014    Priority: High  . Alcohol abuse [F10.10] 12/19/2014    Priority: High  . Alcohol-induced mood disorder (HCC) [F10.94] 12/19/2014    Priority: High  . Bipolar affective disorder, depressed (HCC) [F31.30] 09/24/2014    Priority: High  . Overdose [T50.901A] 09/24/2014    Priority: High  . Alcohol intoxication (HCC) [F10.129]   . Suicidal ideation [R45.851]   . Bipolar affective disorder, currently depressed, moderate (HCC) [F31.32]       Plan Of Care/Follow-up recommendations:  Activity:  as tolerated Diet:  heart healthy diet  Is patient on multiple antipsychotic therapies at discharge:  No   Has Patient had three or more failed trials of antipsychotic monotherapy by history:  No  Recommended Plan for Multiple Antipsychotic Therapies: NA    Astryd Pearcy, PMH-NP 12/19/2014, 11:05 AM
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
## 2014-12-19 NOTE — BH Assessment (Signed)
Discharge is recommended for the patient per Dr. Anastasia FiedlerAkitnayo and Nanine MeansJamison Lord, DNP at this time. Provided patient with outpatient resources for substance abuse and states that she does not have a substance abuse problem. Patient states that she would be interested in therapy. Patient provided outpatient resources and was [facility]. Patient denies questions or concerns and states that she will call transportation once it is time to leave. Patients nurse informed that patient has received resources.   Kaitlyn PokeJoVea Tamir Wallman, LCSW Therapeutic Triage Specialist  Health 12/19/2014 11:17 AM

## 2014-12-19 NOTE — ED Notes (Signed)
Pt has been changed out and security to room to wand pt and belongings.

## 2014-12-19 NOTE — BH Assessment (Signed)
Assessment completed. Consulted Maryjean Mornharles Kober, PA-C who recommended that pt be evaluated by psychiatry for final decision. Informed Elpidio AnisShari Upstill, PA-C and pt of recommendation.

## 2014-12-19 NOTE — ED Notes (Signed)
D/c with support person and resources in NAD

## 2015-06-24 ENCOUNTER — Inpatient Hospital Stay (HOSPITAL_COMMUNITY)
Admission: AD | Admit: 2015-06-24 | Discharge: 2015-06-27 | DRG: 885 | Disposition: A | Discharge status: Home / Self Care | Payer: No Typology Code available for payment source | Source: Intra-hospital | Attending: Psychiatry | Admitting: Psychiatry

## 2015-06-24 ENCOUNTER — Encounter (HOSPITAL_COMMUNITY): Payer: Self-pay | Admitting: *Deleted

## 2015-06-24 ENCOUNTER — Encounter (HOSPITAL_COMMUNITY): Payer: Self-pay | Admitting: Emergency Medicine

## 2015-06-24 ENCOUNTER — Emergency Department (HOSPITAL_COMMUNITY)
Admission: EM | Admit: 2015-06-24 | Discharge: 2015-06-24 | Disposition: A | Discharge status: Other Acute Inpatient Hospital | Payer: Self-pay | Attending: Emergency Medicine | Admitting: Emergency Medicine

## 2015-06-24 DIAGNOSIS — Y906 Blood alcohol level of 120-199 mg/100 ml: Secondary | ICD-10-CM | POA: Diagnosis present

## 2015-06-24 DIAGNOSIS — F3132 Bipolar disorder, current episode depressed, moderate: Secondary | ICD-10-CM | POA: Diagnosis present

## 2015-06-24 DIAGNOSIS — F1210 Cannabis abuse, uncomplicated: Secondary | ICD-10-CM | POA: Diagnosis present

## 2015-06-24 DIAGNOSIS — F1024 Alcohol dependence with alcohol-induced mood disorder: Secondary | ICD-10-CM | POA: Diagnosis present

## 2015-06-24 DIAGNOSIS — F1014 Alcohol abuse with alcohol-induced mood disorder: Secondary | ICD-10-CM | POA: Insufficient documentation

## 2015-06-24 DIAGNOSIS — F1020 Alcohol dependence, uncomplicated: Secondary | ICD-10-CM | POA: Diagnosis not present

## 2015-06-24 DIAGNOSIS — F3163 Bipolar disorder, current episode mixed, severe, without psychotic features: Secondary | ICD-10-CM | POA: Diagnosis present

## 2015-06-24 DIAGNOSIS — F17200 Nicotine dependence, unspecified, uncomplicated: Secondary | ICD-10-CM | POA: Insufficient documentation

## 2015-06-24 DIAGNOSIS — F314 Bipolar disorder, current episode depressed, severe, without psychotic features: Secondary | ICD-10-CM | POA: Insufficient documentation

## 2015-06-24 DIAGNOSIS — F3130 Bipolar disorder, current episode depressed, mild or moderate severity, unspecified: Secondary | ICD-10-CM | POA: Diagnosis present

## 2015-06-24 DIAGNOSIS — F319 Bipolar disorder, unspecified: Secondary | ICD-10-CM | POA: Diagnosis present

## 2015-06-24 LAB — CBC
HCT: 37.1 % (ref 36.0–46.0)
Hemoglobin: 12.5 g/dL (ref 12.0–15.0)
MCH: 29.7 pg (ref 26.0–34.0)
MCHC: 33.7 g/dL (ref 30.0–36.0)
MCV: 88.1 fL (ref 78.0–100.0)
PLATELETS: 272 10*3/uL (ref 150–400)
RBC: 4.21 MIL/uL (ref 3.87–5.11)
RDW: 16.4 % — ABNORMAL HIGH (ref 11.5–15.5)
WBC: 16.4 10*3/uL — AB (ref 4.0–10.5)

## 2015-06-24 LAB — RAPID URINE DRUG SCREEN, HOSP PERFORMED
AMPHETAMINES: NOT DETECTED
BENZODIAZEPINES: NOT DETECTED
Barbiturates: NOT DETECTED
COCAINE: NOT DETECTED
OPIATES: NOT DETECTED
Tetrahydrocannabinol: POSITIVE — AB

## 2015-06-24 LAB — COMPREHENSIVE METABOLIC PANEL
ALT: 11 U/L — AB (ref 14–54)
AST: 26 U/L (ref 15–41)
Albumin: 4.4 g/dL (ref 3.5–5.0)
Alkaline Phosphatase: 75 U/L (ref 38–126)
Anion gap: 8 (ref 5–15)
BUN: 7 mg/dL (ref 6–20)
CHLORIDE: 111 mmol/L (ref 101–111)
CO2: 24 mmol/L (ref 22–32)
CREATININE: 0.75 mg/dL (ref 0.44–1.00)
Calcium: 8.5 mg/dL — ABNORMAL LOW (ref 8.9–10.3)
Glucose, Bld: 79 mg/dL (ref 65–99)
Potassium: 3.7 mmol/L (ref 3.5–5.1)
Sodium: 143 mmol/L (ref 135–145)
Total Bilirubin: 0.6 mg/dL (ref 0.3–1.2)
Total Protein: 7.4 g/dL (ref 6.5–8.1)

## 2015-06-24 LAB — ACETAMINOPHEN LEVEL

## 2015-06-24 LAB — I-STAT BETA HCG BLOOD, ED (MC, WL, AP ONLY): I-stat hCG, quantitative: <5 m[IU]/mL (ref <5)

## 2015-06-24 LAB — ETHANOL: ALCOHOL ETHYL (B): 172 mg/dL — AB (ref <5)

## 2015-06-24 LAB — SALICYLATE LEVEL

## 2015-06-24 MED ORDER — OLANZAPINE 10 MG PO TBDP
10.0000 mg | ORAL_TABLET | Freq: Three times a day (TID) | ORAL | Status: DC | PRN
  Administered 2015-06-25 – 2015-06-26 (×4): 10 mg via ORAL
  Filled 2015-06-24 (×5): qty 1

## 2015-06-24 MED ORDER — QUETIAPINE FUMARATE ER 200 MG PO TB24
400.0000 mg | ORAL_TABLET | Freq: Every day | ORAL | Status: DC
  Administered 2015-06-25 – 2015-06-26 (×2): 400 mg via ORAL
  Filled 2015-06-24 (×4): qty 1

## 2015-06-24 MED ORDER — OLANZAPINE 10 MG PO TBDP
10.0000 mg | ORAL_TABLET | Freq: Three times a day (TID) | ORAL | Status: DC | PRN
  Administered 2015-06-24: 10 mg via ORAL
  Filled 2015-06-24: qty 1

## 2015-06-24 MED ORDER — NICOTINE 21 MG/24HR TD PT24
21.0000 mg | MEDICATED_PATCH | Freq: Every day | TRANSDERMAL | Status: DC
  Administered 2015-06-24: 21 mg via TRANSDERMAL
  Filled 2015-06-24 (×2): qty 1

## 2015-06-24 MED ORDER — LORAZEPAM 1 MG PO TABS
1.0000 mg | ORAL_TABLET | ORAL | Status: DC | PRN
  Filled 2015-06-24: qty 1

## 2015-06-24 MED ORDER — GABAPENTIN 300 MG PO CAPS
300.0000 mg | ORAL_CAPSULE | Freq: Three times a day (TID) | ORAL | Status: DC
  Administered 2015-06-24: 300 mg via ORAL
  Filled 2015-06-24: qty 1

## 2015-06-24 MED ORDER — NICOTINE 21 MG/24HR TD PT24
21.0000 mg | MEDICATED_PATCH | Freq: Every day | TRANSDERMAL | Status: DC
  Administered 2015-06-25 – 2015-06-27 (×3): 21 mg via TRANSDERMAL
  Filled 2015-06-24 (×4): qty 1

## 2015-06-24 MED ORDER — HYDROXYZINE HCL 25 MG PO TABS: 25.0000 mg | ORAL_TABLET | Freq: Four times a day (QID) | ORAL | Status: DC | PRN

## 2015-06-24 MED ORDER — GABAPENTIN 300 MG PO CAPS
300.0000 mg | ORAL_CAPSULE | Freq: Three times a day (TID) | ORAL | Status: DC
  Administered 2015-06-24 – 2015-06-27 (×9): 300 mg via ORAL
  Filled 2015-06-24 (×12): qty 1

## 2015-06-24 MED ORDER — HYDROXYZINE HCL 25 MG PO TABS
25.0000 mg | ORAL_TABLET | Freq: Four times a day (QID) | ORAL | Status: DC | PRN
  Filled 2015-06-24 (×2): qty 1
  Filled 2015-06-24: qty 10

## 2015-06-24 MED ORDER — CITALOPRAM HYDROBROMIDE 20 MG PO TABS
30.0000 mg | ORAL_TABLET | Freq: Every day | ORAL | Status: DC
  Administered 2015-06-25 – 2015-06-27 (×3): 30 mg via ORAL
  Filled 2015-06-24 (×4): qty 1

## 2015-06-24 MED ORDER — ALUM & MAG HYDROXIDE-SIMETH 200-200-20 MG/5ML PO SUSP: 30.0000 mL | ORAL | Status: DC | PRN

## 2015-06-24 MED ORDER — MAGNESIUM HYDROXIDE 400 MG/5ML PO SUSP: 30.0000 mL | Freq: Every day | ORAL | Status: DC | PRN

## 2015-06-24 MED ORDER — QUETIAPINE FUMARATE 25 MG PO TABS
25.0000 mg | ORAL_TABLET | Freq: Every day | ORAL | Status: DC
  Administered 2015-06-25 – 2015-06-27 (×3): 25 mg via ORAL
  Filled 2015-06-24 (×4): qty 1
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
## 2015-06-24 NOTE — ED Notes (Signed)
Non emergency communications called for transport.
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
## 2015-06-24 NOTE — BH Assessment (Signed)
[facility] Assessment Progress Note  Per Thedore MinsMojeed Akintayo, MD, this pt requires psychiatric hospitalization at this time.  Berneice Heinrichina Tate, RN, [id number].  Pt presents under IVC, which Dr Jannifer FranklinAkintayo has upheld, and [facility].  Pt's nurse has been notified, and agrees to call report to [phone].  Pt is to be transported via Patent examinerlaw enforcement.  Doylene Canninghomas Suhan Paci, MA Triage Specialist [phone]

## 2015-06-24 NOTE — ED Notes (Signed)
MD at bedside. 

## 2015-06-24 NOTE — Tx Team (Signed)
Initial Interdisciplinary Treatment Plan   PATIENT STRESSORS: Financial difficulties Loss of son to suicide Occupational concerns Substance abuse   PATIENT STRENGTHS: Ability for insight Average or above average intelligence Motivation for treatment/growth Supportive family/friends   PROBLEM LIST: Problem List/Patient Goals Date to be addressed Date deferred Reason deferred Estimated date of resolution  At risk for suicide 06/24/2015  06/24/2015   D/C  Depression 06/24/2015  06/24/2015   D/C  Grieving 06/24/2015  06/24/2015   D/C  "Stress level" 06/24/2015  06/24/2015   D/C  "Coping with loss of my son" 06/24/2015  06/24/2015   D/C                           DISCHARGE CRITERIA:  Improved stabilization in mood, thinking, and/or behavior Medical problems require only outpatient monitoring Motivation to continue treatment in a less acute level of care Need for constant or close observation no longer present Reduction of life-threatening or endangering symptoms to within safe limits Withdrawal symptoms are absent or subacute and managed without 24-hour nursing intervention  PRELIMINARY DISCHARGE PLAN: Outpatient therapy Return to previous living arrangement  PATIENT/FAMIILY INVOLVEMENT: This treatment plan has been presented to and reviewed with the patient, Kaitlyn Shepherd.  The patient and family have been given the opportunity to ask questions and make suggestions.  Larry SierrasMiddleton, Maddy Graham P 06/24/2015, 5:59 PM

## 2015-06-24 NOTE — ED Notes (Signed)
Pt brought in by sheriff under IVC  Pt and her boyfriend had an argument earlier tonight and she called the sheriff and told them she was feeling suicidal  Pt states she is also feeling homicidal  Pt states she has been drinking heavily tonight  Pt states her son killed himself 3 months ago  Pt tearful in triage  Pt is intoxicated

## 2015-06-24 NOTE — Progress Notes (Signed)
D.  Pt in bed on approach, has remained sleeping for the first part of this shift.  Pt did not attend evening AA group, she is a new admission to the unit.  A.  Will continue to monitor Pt for safety  R. PT remains safe on the unit

## 2015-06-24 NOTE — ED Notes (Signed)
Pt has a necklace on with her sons ashes in it  Pt does not want to take it off

## 2015-06-24 NOTE — Progress Notes (Signed)
Admission Note:  40 year old female who presents IVC, in no acute distress, for the treatment of SI and Depression. Per report, patient stated "I wanted to die and kill myself to be with my son".  Patient appears flat and depressed. Patient was calm and cooperative with admission process. Patient currently denies SI and contracts for safety upon admission. Patient reports AH stating "I hear voices.  I hear something that sounds like a radio. I can't tell what's being said or playing but it's always playing".  Patient reports stressor as the recent death of her son due to suicide and financial issues.  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  Patient reports prior suicide attempts and self-harm behaviors.  Patient reports alcohol induced aggression.  Patient reports smoking 2 packs of cigarettes daily and marijuana use.  Patient currently unemployed and lives with her boyfriend.  [facility], patient would like to work on her "stress level" and "coping with the loss of my son".  Skin was assessed. See RN note on skin assessment.  Patient searched and no contraband found, POC and unit policies explained and understanding verbalized. Consents obtained. Food and fluids offered and accepted.  Patient had no additional questions or concerns.

## 2015-06-24 NOTE — ED Notes (Signed)
Pt started yelling at writer stating that she needs to see a doctor and needs some meds.  Writer told pt that she has told RN and waiting for orders to come in.  Pt is cursing at Clinical research associatewriter calling her a "cunt" and saying she's just leaving her here to just lay in the chair with no one to see her.  Writer has checked on the pt, offered her a blanket to make her feel comfortable. Pt is still cursing at Clinical research associatewriter.

## 2015-06-24 NOTE — ED Provider Notes (Signed)
CSN: [id number]     Arrival date & time 06/24/15  0335 History  By signing my name below, I, Kaitlyn Shepherd, attest that this documentation has been prepared under the direction and in the presence of Keishawn Darsey, MD. Electronically Signed: Phillis HaggisGabriella Shepherd, ED Scribe. 06/24/2015. 3:55 AM.   Chief Complaint  Patient presents with  . Suicidal   Patient is a 40 y.o. female presenting with mental health disorder. The history is provided by the patient. No language interpreter was used.  Mental Health Problem Presenting symptoms: self mutilation, suicidal thoughts and suicide attempt   Patient accompanied by:  Law enforcement Degree of incapacity (severity):  Moderate Onset quality:  Sudden Timing:  Constant Progression:  Worsening Chronicity:  New Context: alcohol use and stressful life event   Treatment compliance:  Untreated Ineffective treatments:  None tried Risk factors: hx of suicide attempts   HPI Comments: Kaitlyn Shepherd is a 40 y.o. Female with a hx of panic attacks, seizures, and bipolar 1 disorder brought in by sheriff who presents to the Emergency Department complaining of SI onset PTA. Pt brought in under IVC after calling the sheriff stating that she will kill herself following an argument with her boyfriend. Pt states that she has been drinking heavily tonight and tried to cut her wrists. She reports hx of hospitalization in October and states that she last attempted suicide by overdosing on medications. She denies other symptoms.   Past Medical History  Diagnosis Date  . Migraine   . Bipolar 1 disorder (HCC)   . Anxiety   . Panic attacks   . Seizures [facility](HCC)    Past Surgical History  Procedure Laterality Date  . Tubal ligation     History reviewed. No pertinent family history. Social History  Substance Use Topics  . Smoking status: Current Every Day Smoker  . Smokeless tobacco: Never Used  . Alcohol Use: Yes     Comment: heavy   OB History    No data  available     Review of Systems  Psychiatric/Behavioral: Positive for suicidal ideas and self-injury.  All other systems reviewed and are negative.  Allergies  Ativan; Buspar; Darvocet; Lithium; and Toradol  Home Medications   Prior to Admission medications   Medication Sig Start Date End Date Taking? Authorizing Provider  citalopram (CELEXA) 40 MG tablet Take 20 mg by mouth daily.     Historical Provider, MD  gabapentin (NEURONTIN) 300 MG capsule Take 300 mg by mouth 2 (two) times daily as needed (pain).    Historical Provider, MD  HYDROcodone-acetaminophen (NORCO/VICODIN) 5-325 MG per tablet Take 1-2 tablets by mouth every 4 (four) hours as needed. Patient not taking: Reported on 09/24/2014 02/28/13   Linwood DibblesJon Knapp, MD  QUEtiapine (SEROQUEL) 300 MG tablet Take 300 mg by mouth at bedtime.    Historical Provider, MD   BP 108/80 mmHg  Pulse 63  Temp(Src) 97.9 F (36.6 C) (Oral)  Resp 18  SpO2 100%  LMP 01/08/2013 Physical Exam  Constitutional: She is oriented to person, place, and time. She appears well-developed and well-nourished. No distress.  HENT:  Head: Normocephalic and atraumatic.  Mouth/Throat: Oropharynx is clear and moist. No oropharyngeal exudate.  Trachea midline  Eyes: Conjunctivae and EOM are normal. Pupils are equal, round, and reactive to light.  Neck: Trachea normal and normal range of motion. Neck supple. No JVD present. Carotid bruit is not present.  Cardiovascular: Normal rate and regular rhythm.  Exam reveals no gallop and no friction rub.  No murmur heard. Pulmonary/Chest: Effort normal and breath sounds normal. No stridor. She has no wheezes. She has no rales.  Abdominal: Soft. Bowel sounds are normal. She exhibits no mass. There is no tenderness. There is no rebound and no guarding.  Musculoskeletal: Normal range of motion.  Lymphadenopathy:    She has no cervical adenopathy.  Neurological: She is alert and oriented to person, place, and time. She has  normal reflexes. No cranial nerve deficit. She exhibits normal muscle tone. Coordination normal.  Cranial nerves 2-12 intact  Skin: Skin is warm and dry. She is not diaphoretic.  Scratches on the wrists  Psychiatric: She has a normal mood and affect. Her behavior is normal.  Nursing note and vitals reviewed.   ED Course  Procedures (including critical care time) DIAGNOSTIC STUDIES: Oxygen Saturation is 100% on RA, normal by my interpretation.    COORDINATION OF CARE: 3:54 AM-Discussed treatment plan which includes labs with pt at bedside and pt agreed to plan.    Labs Review Labs Reviewed  COMPREHENSIVE METABOLIC PANEL  ETHANOL  SALICYLATE LEVEL  ACETAMINOPHEN LEVEL  CBC  URINE RAPID DRUG SCREEN, HOSP PERFORMED  I-STAT BETA HCG BLOOD, ED ([facility], [facility], [facility] ONLY)    Imaging Review No results found. I have personally reviewed and evaluated these images and lab results as part of my medical decision-making.   EKG Interpretation None      MDM   Filed Vitals:   06/24/15 0343  BP: 108/80  Pulse: 63  Temp: 97.9 F (36.6 C)  Resp: 18   Results for orders placed or performed during the hospital encounter of 12/19/14  Comprehensive metabolic panel  Result Value Ref Range   Sodium 134 (L) 135 - 145 mmol/L   Potassium 3.7 3.5 - 5.1 mmol/L   Chloride 102 101 - 111 mmol/L   CO2 24 22 - 32 mmol/L   Glucose, Bld 118 (H) 65 - 99 mg/dL   BUN <5 (L) 6 - 20 mg/dL   Creatinine, Ser 4.09 0.44 - 1.00 mg/dL   Calcium 9.1 8.9 - 81.1 mg/dL   Total Protein 7.4 6.5 - 8.1 g/dL   Albumin 4.4 3.5 - 5.0 g/dL   AST 25 15 - 41 U/L   ALT 13 (L) 14 - 54 U/L   Alkaline Phosphatase 83 38 - 126 U/L   Total Bilirubin 0.3 0.3 - 1.2 mg/dL   GFR calc non Af Amer >60 >60 mL/min   GFR calc Af Amer >60 >60 mL/min   Anion gap 8 5 - 15  Ethanol  Result Value Ref Range   Alcohol, Ethyl (B) 133 (H) <5 mg/dL  CBC with Differential  Result Value Ref Range   WBC 9.9 4.0 - 10.5 K/uL   RBC 4.50 3.87 -  5.11 MIL/uL   Hemoglobin 12.9 12.0 - 15.0 g/dL   HCT 91.4 78.2 - 95.6 %   MCV 86.0 78.0 - 100.0 fL   MCH 28.7 26.0 - 34.0 pg   MCHC 33.3 30.0 - 36.0 g/dL   RDW 21.3 08.6 - 57.8 %   Platelets 284 150 - 400 K/uL   Neutrophils Relative % 65 %   Neutro Abs 6.4 1.7 - 7.7 K/uL   Lymphocytes Relative 29 %   Lymphs Abs 2.8 0.7 - 4.0 K/uL   Monocytes Relative 6 %   Monocytes Absolute 0.6 0.1 - 1.0 K/uL   Eosinophils Relative 0 %   Eosinophils Absolute 0.0 0.0 - 0.7 K/uL   Basophils  Relative 0 %   Basophils Absolute 0.0 0.0 - 0.1 K/uL  Urine rapid drug screen (hosp performed)  Result Value Ref Range   Opiates NONE DETECTED NONE DETECTED   Cocaine POSITIVE (A) NONE DETECTED   Benzodiazepines NONE DETECTED NONE DETECTED   Amphetamines NONE DETECTED NONE DETECTED   Tetrahydrocannabinol POSITIVE (A) NONE DETECTED   Barbiturates NONE DETECTED NONE DETECTED  Pregnancy, urine  Result Value Ref Range   Preg Test, Ur NEGATIVE NEGATIVE   No results found.  Medications - No data to display  Final diagnoses:  None   Filed Vitals:   06/24/15 0842 06/24/15 1203  BP: 120/72 115/63  Pulse: 69 57  Temp:  98.3 F (36.8 C)  Resp: 16 18   Results for orders placed or performed during the hospital encounter of 06/24/15  Comprehensive metabolic panel  Result Value Ref Range   Sodium 143 135 - 145 mmol/L   Potassium 3.7 3.5 - 5.1 mmol/L   Chloride 111 101 - 111 mmol/L   CO2 24 22 - 32 mmol/L   Glucose, Bld 79 65 - 99 mg/dL   BUN 7 6 - 20 mg/dL   Creatinine, Ser 4.090.75 0.44 - 1.00 mg/dL   Calcium 8.5 (L) 8.9 - 10.3 mg/dL   Total Protein 7.4 6.5 - 8.1 g/dL   Albumin 4.4 3.5 - 5.0 g/dL   AST 26 15 - 41 U/L   ALT 11 (L) 14 - 54 U/L   Alkaline Phosphatase 75 38 - 126 U/L   Total Bilirubin 0.6 0.3 - 1.2 mg/dL   GFR calc non Af Amer >60 >60 mL/min   GFR calc Af Amer >60 >60 mL/min   Anion gap 8 5 - 15  Ethanol  Result Value Ref Range   Alcohol, Ethyl (B) 172 (H) <5 mg/dL  Salicylate level   Result Value Ref Range   Salicylate Lvl <4.0 2.8 - 30.0 mg/dL  Acetaminophen level  Result Value Ref Range   Acetaminophen (Tylenol), Serum <10 (L) 10 - 30 ug/mL  cbc  Result Value Ref Range   WBC 16.4 (H) 4.0 - 10.5 K/uL   RBC 4.21 3.87 - 5.11 MIL/uL   Hemoglobin 12.5 12.0 - 15.0 g/dL   HCT 81.137.1 91.436.0 - 78.246.0 %   MCV 88.1 78.0 - 100.0 fL   MCH 29.7 26.0 - 34.0 pg   MCHC 33.7 30.0 - 36.0 g/dL   RDW 95.616.4 (H) 21.311.5 - 08.615.5 %   Platelets 272 150 - 400 K/uL  Rapid urine drug screen (hospital performed)  Result Value Ref Range   Opiates NONE DETECTED NONE DETECTED   Cocaine NONE DETECTED NONE DETECTED   Benzodiazepines NONE DETECTED NONE DETECTED   Amphetamines NONE DETECTED NONE DETECTED   Tetrahydrocannabinol POSITIVE (A) NONE DETECTED   Barbiturates NONE DETECTED NONE DETECTED  I-Stat beta hCG blood, ED  Result Value Ref Range   I-stat hCG, quantitative <5.0 <5 mIU/mL   Comment 3           No results found.  Medications - No data to display  Pending TTS evaluation  I personally performed the services described in this documentation, which was scribed in my presence. The recorded information has been reviewed and is accurate.      Cy BlamerApril Charna Neeb, MD 06/25/15 [phone]

## 2015-06-24 NOTE — Progress Notes (Signed)
Patient denied SI and HI, contracts for safety.  Denied visual hallucinations but stated she does hear words, mumbling, soft song at times.  Denied pain.  Respirations even and unlabored.  No signs/symptoms of pain/distress noted on patient's face/body movements.

## 2015-06-24 NOTE — ED Notes (Signed)
Pt has in belonging bag:  3 black t-shirt, 2 blue shirts, blue jeans, tan bra, 3 pill bottles (quetiapine fumarate, serooquel, citalopram) grey smile face slippers.

## 2015-06-25 ENCOUNTER — Encounter (HOSPITAL_COMMUNITY): Payer: Self-pay | Admitting: Emergency Medicine

## 2015-06-25 ENCOUNTER — Other Ambulatory Visit: Payer: Self-pay

## 2015-06-25 DIAGNOSIS — F1014 Alcohol abuse with alcohol-induced mood disorder: Secondary | ICD-10-CM | POA: Insufficient documentation

## 2015-06-25 DIAGNOSIS — F314 Bipolar disorder, current episode depressed, severe, without psychotic features: Principal | ICD-10-CM

## 2015-06-25 DIAGNOSIS — F1210 Cannabis abuse, uncomplicated: Secondary | ICD-10-CM

## 2015-06-25 LAB — LIPID PANEL
CHOL/HDL RATIO: 5 ratio
CHOLESTEROL: 189 mg/dL (ref 0–200)
HDL: 38 mg/dL — AB (ref >40)
LDL Cholesterol: 110 mg/dL — ABNORMAL HIGH (ref 0–99)
Triglycerides: 207 mg/dL — ABNORMAL HIGH (ref <150)
VLDL: 41 mg/dL — ABNORMAL HIGH (ref 0–40)

## 2015-06-25 LAB — TSH: TSH: 2.524 u[IU]/mL (ref 0.350–4.500)

## 2015-06-25 MED ORDER — TRAZODONE HCL 50 MG PO TABS
50.0000 mg | ORAL_TABLET | Freq: Every evening | ORAL | Status: DC | PRN
  Administered 2015-06-26: 50 mg via ORAL
  Filled 2015-06-25: qty 7
  Filled 2015-06-25: qty 1
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
## 2015-06-25 NOTE — [facility] Group Notes (Signed)
Date:  06/25/2015 Time:  10:00AM-11:00AM  Group Topic/Focus:  The main focus of today's therapy group was to identify unhealthy coping techniques which led to hospitalization and to start looking at healthy coping skills to be learned to use instead in similar circumstances.  Motivational Interviewing was used to highlight patient ambivilence.  Scaling questions were asked to help define for patients where they are right now on the issue they had initially raised.  Participation Level:  Active  Participation Quality:  Attentive  Affect:  Blunted and Irritable  Cognitive:  Oriented  Insight: Limited  Engagement in Group:  Limited  Modes of Intervention:  Discussion and Motivational Interviewing  Additional Comments:  Pt did not want to share in group but did listen throughout and stayed for all of group.  She did say that her desire to change is 1 out of 10, as she does not feel she has a problem.  Sarina SerGrossman-Orr, Mirha Brucato Jo 06/25/2015, 12:28 PM
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
## 2015-06-25 NOTE — Progress Notes (Signed)
D Talbert ForestShirley is depressed, flat and withdrawan. She avoids eye contact. She is mad at the world. She starts crying when this nurse tries to speak with her. A She completed her daily assessment and on it she wrote she denies SI today and she rated her depression, hopelessness and anxeity " 0/0/0/", respectively. R To cont to offer support and encouragement.

## 2015-06-26 NOTE — [facility] Group Notes (Signed)
[facility] Group Notes: (Clinical Social Work)   06/26/2015      Type of Therapy:  Group Therapy   Participation Level:  Did Not Attend despite MHT prompting   Tenley Winward Grossman-Orr, LCSW 06/26/2015, 11:56 AM     

## 2015-06-26 NOTE — Progress Notes (Signed)
D.  Pt pleasant on approach, states she had hoped to leave today.  Pt remains hopeful for discharge soon.  Pt was positive for evening AA group, interacting appropriately with peers on the unit.   Pt denies SI/HI/hallucinations at this time.  A.  Support and encouragement offered, medication given as ordered  R.  Pt remains safe on the unit.

## 2015-06-26 NOTE — Progress Notes (Signed)
Psychoeducational Group Note  Date:  06/26/2015 Time:  0900 Group Topic/Focus:  Daily Goals Group:  The group focuses on teaching patients how to set attainable goals that will aid them in their recovery. Also discussed issues they want to address with the practitioner today.  Participation Level:  Did Not Attend   Additional Comments:    Rich BraveDuke, Denaja Verhoeven Lynn 10:29 AM. 06/26/2015

## 2015-06-26 NOTE — Progress Notes (Signed)
[facility]  06/26/2015 9:03 AM Kaitlyn GripShirley Shepherd  MRN:  [id number] Subjective:  Patient reports "I just want go home to my dogs."  Objective: Kaitlyn GripShirley Shepherd is awake, alert and oriented X4.Marland Kitchen.  Denies suicidal or homicidal ideation. Denies auditory or visual hallucination and does not appear to be responding to internal stimuli. Patient states " I just need to apologize to the doctor for my actions on yesterday."  Patient reports she is medication compliant without mediation side effects.Patient reports " Lavenia Atlasve been here since Thursday." States her depression 0/10 today.  Reports good appetite and resting well. Support, encouragement and reassurance was provided.   Principal Problem: Bipolar affective disorder, depressed, severe (HCC) Diagnosis:   Patient Active Problem List   Diagnosis Date Noted  . Alcohol abuse with alcohol-induced mood disorder (HCC) [F10.14]   . Cannabis abuse [F12.10]   . Alcohol use disorder, severe, dependence (HCC) [F10.20] 06/24/2015  . Bipolar affective disorder, depressed, severe (HCC) [F31.4] 06/24/2015  . Cocaine abuse [F14.10] 12/19/2014  . Alcohol abuse [F10.10] 12/19/2014  . Alcohol-induced mood disorder (HCC) [F10.94] 12/19/2014  . Alcohol intoxication (HCC) [F10.129]   . Suicidal ideation [R45.851]   . Bipolar affective disorder, currently depressed, moderate (HCC) [F31.32]   . Bipolar affective disorder, depressed (HCC) [F31.30] 09/24/2014  . Overdose [T50.901A] 09/24/2014   Total Time spent with patient: 20 minutes  Past Psychiatric History: See Above  Past Medical History:  Past Medical History  Diagnosis Date  . Migraine   . Bipolar 1 disorder (HCC)   . Anxiety   . Panic attacks   . Seizures [facility](HCC)     Past Surgical History  Procedure Laterality Date  . Tubal ligation     Family History: History reviewed. No pertinent family history. Family Psychiatric  History: See Above Social History:  History  Alcohol Use  . Yes   Comment: heavy     History  Drug Use  . Yes  . Special: Marijuana    Social History   Social History  . Marital Status: Legally Separated    Spouse Name: N/A  . Number of Children: N/A  . Years of Education: N/A   Social History Main Topics  . Smoking status: Current Every Day Smoker  . Smokeless tobacco: Never Used  . Alcohol Use: Yes     Comment: heavy  . Drug Use: Yes    Special: Marijuana  . Sexual Activity: Not Currently   Other Topics Concern  . None   Social History Narrative   Additional Social History:                         Sleep: Good  Appetite:  Fair  Current Medications: Current Facility-Administered Medications  Medication Dose Route Frequency Provider Last Rate Last Dose  . alum & mag hydroxide-simeth (MAALOX/MYLANTA) 200-200-20 MG/5ML suspension 30 mL  30 mL Oral Q4H PRN Charm RingsJamison Y Lord, NP      . citalopram (CELEXA) tablet 30 mg  30 mg Oral Daily Kristeen MansFran E Hobson, NP   30 mg at 06/26/15 0856  . gabapentin (NEURONTIN) capsule 300 mg  300 mg Oral TID Charm RingsJamison Y Lord, NP   300 mg at 06/26/15 0856  . hydrOXYzine (ATARAX/VISTARIL) tablet 25 mg  25 mg Oral Q6H PRN Charm RingsJamison Y Lord, NP   25 mg at 06/25/15 0940  . magnesium hydroxide (MILK OF MAGNESIA) suspension 30 mL  30 mL Oral Daily PRN Charm RingsJamison Y Lord, NP      .  nicotine (NICODERM CQ - dosed in mg/24 hours) patch 21 mg  21 mg Transdermal Daily Charm RingsJamison Y Lord, NP   21 mg at 06/26/15 0857  . OLANZapine zydis (ZYPREXA) disintegrating tablet 10 mg  10 mg Oral Q8H PRN Charm RingsJamison Y Lord, NP   10 mg at 06/25/15 2119  . QUEtiapine (SEROQUEL XR) 24 hr tablet 400 mg  400 mg Oral QHS Kristeen MansFran E Hobson, NP   400 mg at 06/25/15 2118  . QUEtiapine (SEROQUEL) tablet 25 mg  25 mg Oral Daily Kristeen MansFran E Hobson, NP   25 mg at 06/26/15 0856  . traZODone (DESYREL) tablet 50 mg  50 mg Oral QHS PRN Kristeen MansFran E Hobson, NP        Lab Results:  Results for orders placed or performed during the hospital encounter of 06/24/15 (from the past  48 hour(s))  TSH     Status: None   Collection Time: 06/25/15  6:28 PM  Result Value Ref Range   TSH 2.524 0.350 - 4.500 uIU/mL    Comment: [facility]  Lipid panel     Status: Abnormal   Collection Time: 06/25/15  6:28 PM  Result Value Ref Range   Cholesterol 189 0 - 200 mg/dL   Triglycerides 161207 (H) <150 mg/dL   HDL 38 (L) >09>40 mg/dL   Total CHOL/HDL Ratio 5.0 RATIO   VLDL 41 (H) 0 - 40 mg/dL   LDL Cholesterol 604110 (H) 0 - 99 mg/dL    Comment:        Total Cholesterol/HDL:CHD Risk Coronary Heart Disease Risk Table                     Men   Women  1/2 Average Risk   3.4   3.3  Average Risk       5.0   4.4  2 X Average Risk   9.6   7.1  3 X Average Risk  23.4   11.0        Use the calculated Patient Ratio above and the CHD Risk Table to determine the patient's CHD Risk.        ATP III CLASSIFICATION (LDL):  <100     mg/dL   Optimal  540-981100-129  mg/dL   Near or Above                    Optimal  130-159  mg/dL   Borderline  191-478160-189  mg/dL   High  >295>190     mg/dL   Very High [facility]     Blood Alcohol level:  Lab Results  Component Value Date   [facility] 172* 06/24/2015   ETH 133* 12/19/2014    Metabolic Disorder Labs: No results found for: HGBA1C, MPG No results found for: PROLACTIN Lab Results  Component Value Date   CHOL 189 06/25/2015   TRIG 207* 06/25/2015   HDL 38* 06/25/2015   CHOLHDL 5.0 06/25/2015   VLDL 41* 06/25/2015   LDLCALC 110* 06/25/2015    Physical Findings: AIMS: Facial and Oral Movements Muscles of Facial Expression: None, normal Lips and Perioral Area: None, normal Jaw: None, normal Tongue: None, normal,Extremity Movements Upper (arms, wrists, hands, fingers): None, normal Lower (legs, knees, ankles, toes): None, normal, Trunk Movements Neck, shoulders, hips: None, normal, Overall Severity Severity of abnormal movements (highest score from questions above): None, normal Incapacitation due  to abnormal movements: None, normal Patient's awareness of abnormal movements (  rate only patient's report): No Awareness, Dental Status Current problems with teeth and/or dentures?: No Does patient usually wear dentures?: No  CIWA:    COWS:     Musculoskeletal: Strength & Muscle Tone: within normal limits Gait & Station: normal Patient leans: N/A  Psychiatric Specialty Exam: Physical Exam  Constitutional: She is oriented to person, place, and time. She appears well-developed.  Musculoskeletal: Normal range of motion.  Neurological: She is alert and oriented to person, place, and time.  Psychiatric: She has a normal mood and affect. Her behavior is normal.    Review of Systems  HENT:       Missing front teeth   Psychiatric/Behavioral: Negative for suicidal ideas and hallucinations. Depression: stable. The patient is nervous/anxious. The patient does not have insomnia.   All other systems reviewed and are negative.   Blood pressure 109/65, pulse 67, temperature 98 F (36.7 C), temperature source Oral, resp. rate 16, height  (1.626 m), weight 58.514 kg (129 lb), last menstrual period 01/08/2013, SpO2 100 %.Body mass index is 22.13 kg/(m^2).  General Appearance: Casual  Eye Contact:  Good  Speech:  Clear and Coherent  Volume:  Normal  Mood:  Euthymic  Affect:  Congruent  Thought Process:  Coherent  Orientation:  Full (Time, Place, and Person)  Thought Content:  Hallucinations: None  Suicidal Thoughts:  No  Homicidal Thoughts:  No  Memory:  Immediate;   Fair Recent;   Good Remote;   Fair  Judgement:  Fair  Insight:  Good  Psychomotor Activity:  Normal  Concentration:  Concentration: Fair  Recall:  Fair  Fund of Knowledge:  Good  Language:  Good  Akathisia:  No  Handed:  Right  AIMS (if indicated):     Assets:  Desire for Improvement Leisure Time Social Support  ADL's:  Intact  Cognition:  WNL  Sleep:  Number of Hours: 6.5     I agree with current treatment  plan on 06/26/2015, Patient seen face-to-face for psychiatric evaluation follow-up, chart reviewed. Reviewed the information documented and agree with the treatment plan.  Treatment Plan Summary: Daily contact with patient to assess and evaluate symptoms and progress in treatment and Medication management  Daily contact with patient to assess and evaluate symptoms and progress in treatment and Medication management Continue with Celexa 30 mg, Neurotin 300 mgSeroquel s for mood stabilization/insomia., Zyprexia  for agitation and  Will continue to monitor vitals ,medication compliance and treatment side effects while patient is here.  TSH, A1C, EKG, Prolactin and Lipid panel- for antipsychotic  CI WA scoring monitor for withdrawals.  lReviewed labsBAL - 172, UDS - positive for THC CSW will start working on disposition.  Patient to participate in therapeutic milieu   Oneta Rack, NP 06/26/2015, 9:03 AM  Reviewed the information documented and agree with the treatment plan.  Lakaisha Danish [facility] 06/26/2015 2:32 PM

## 2015-06-26 NOTE — Progress Notes (Addendum)
D Kaitlyn ForestShirley remains very upset...thinking about her family and grieving her recent losses. She takes her medications as planned. A She requested " something for my nerves" after lunch and was given  zyprexa 10 mg po prn. She stated she was upset because she had found out her BF was going " out and partying while I'm up here". Pt encouraged to take deep breaths, encouraged to stay in the now andnot respond to heresay and to wait and ask BF for facts.  R Safety in place and poc cont.

## 2015-06-27 DIAGNOSIS — F3163 Bipolar disorder, current episode mixed, severe, without psychotic features: Secondary | ICD-10-CM | POA: Diagnosis present

## 2015-06-27 DIAGNOSIS — F1020 Alcohol dependence, uncomplicated: Secondary | ICD-10-CM

## 2015-06-27 DIAGNOSIS — F1210 Cannabis abuse, uncomplicated: Secondary | ICD-10-CM | POA: Diagnosis present

## 2015-06-27 LAB — HEMOGLOBIN A1C
Hgb A1c MFr Bld: 5 % (ref 4.8–5.6)
Mean Plasma Glucose: 97 mg/dL

## 2015-06-27 LAB — PROLACTIN: PROLACTIN: 35.5 ng/mL — AB (ref 4.8–23.3)

## 2015-06-27 MED ORDER — HYDROXYZINE HCL 25 MG PO TABS: 25.0000 mg | ORAL_TABLET | Freq: Four times a day (QID) | ORAL | Status: DC | PRN

## 2015-06-27 MED ORDER — QUETIAPINE FUMARATE 25 MG PO TABS: 25.0000 mg | ORAL_TABLET | Freq: Every day | ORAL | Status: DC

## 2015-06-27 MED ORDER — QUETIAPINE FUMARATE ER 400 MG PO TB24: 400.0000 mg | ORAL_TABLET | Freq: Every day | ORAL | Status: DC

## 2015-06-27 MED ORDER — GABAPENTIN 300 MG PO CAPS: 300.0000 mg | ORAL_CAPSULE | Freq: Three times a day (TID) | ORAL | Status: DC

## 2015-06-27 MED ORDER — NICOTINE 21 MG/24HR TD PT24: 21.0000 mg | MEDICATED_PATCH | Freq: Every day | TRANSDERMAL | Status: DC

## 2015-06-27 MED ORDER — TRAZODONE HCL 50 MG PO TABS: 50.0000 mg | ORAL_TABLET | Freq: Every evening | ORAL | Status: DC | PRN

## 2015-06-27 MED ORDER — CITALOPRAM HYDROBROMIDE 10 MG PO TABS: 30.0000 mg | ORAL_TABLET | Freq: Every day | ORAL | Status: DC

## 2015-06-27 MED ORDER — CITALOPRAM HYDROBROMIDE 10 MG PO TABS
30.0000 mg | ORAL_TABLET | Freq: Every day | ORAL | Status: DC
  Filled 2015-06-27: qty 21
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
## 2015-06-27 NOTE — [facility] Suicide Risk Assessment (Signed)
[facility] INPATIENT:  Family/Significant Other Suicide Prevention Education  Suicide Prevention Education:  Contact Attempts: Lennice SitesRoger Brown (pt's boyfriend) [phone] has been identified by the patient as the family member/significant other with whom the patient will be residing, and identified as the person(s) who will aid the patient in the event of a mental health crisis.  With written consent from the patient, two attempts were made to provide suicide prevention education, prior to and/or following the patient's discharge.  We were unsuccessful in providing suicide prevention education.  A suicide education pamphlet was given to the patient to share with family/significant other.  Date and time of first attempt: 06/27/15 at 9:45AM Date and time of second attempt: 06/27/15 at 11:04AM (voicemail left requesting call back at his earliest convenience).   Smart, Hal Norrington LCSW 06/27/2015, 11:04 AM

## 2015-06-27 NOTE — Progress Notes (Signed)
Patient did not attend the evening speaker AA meeting. Pt was notified that group was beginning but returned to her room.   

## 2015-06-27 NOTE — Progress Notes (Signed)
D: Pt presents with depressed affect and mood.  Pt reports her goal is "just to be good."  Pt reports she accomplished her goal.  Pt reports she is "ready to go home."  Pt did not attend evening group.  She reports she did not attend because "I only drink 2 or 3 times a year."  Pt reported to Clinical research associatewriter that "I lost my son to suicide, his name was [name] too."  Pt denies SI/HI, denies hallucinations, denies pain.  Pt has interacted appropriately with staff and peers tonight.   A: Introduced self to pt.  Actively listened to pt and offered support and encouragement.  Medication administered per order.  PRN medication administered for agitation and sleep.    R: Pt is safe on the unit.  She is compliant with medications.  She verbally contracts for safety.  Will continue to monitor and assess.

## 2015-06-27 NOTE — [facility] Suicide Risk Assessment (Signed)
[facility]   Principal Problem: Bipolar disorder, curr episode mixed, severe, w/o psychotic features [facility](HCC) Discharge Diagnoses:  Patient Active Problem List   Diagnosis Date Noted  . Cannabis use disorder, mild, abuse [F12.10] 06/27/2015  . Bipolar disorder, curr episode mixed, severe, w/o psychotic features (HCC) [F31.63] 06/27/2015  . Alcohol abuse with alcohol-induced mood disorder (HCC) [F10.14]   . Alcohol use disorder, severe, dependence (HCC) [F10.20] 06/24/2015  . Cocaine abuse [F14.10] 12/19/2014  . Alcohol abuse [F10.10] 12/19/2014  . Alcohol-induced mood disorder (HCC) [F10.94] 12/19/2014  . Alcohol intoxication (HCC) [F10.129]   . Suicidal ideation [R45.851]   . Bipolar affective disorder, currently depressed, moderate (HCC) [F31.32]   . Bipolar affective disorder, depressed (HCC) [F31.30] 09/24/2014  . Overdose [T50.901A] 09/24/2014    Total Time spent with patient: 30 minutes  Musculoskeletal: Strength & Muscle Tone: within normal limits Gait & Station: normal Patient leans: N/A  Psychiatric Specialty Exam: Review of Systems  Psychiatric/Behavioral: Positive for substance abuse. Negative for depression.  All other systems reviewed and are negative.   Blood pressure 107/68, pulse 76, temperature 97.6 F (36.4 C), temperature source Oral, resp. rate 20, height 5\' 4"  (1.626 m), weight 58.514 kg (129 lb), last menstrual period 01/08/2013, SpO2 100 %.Body mass index is 22.13 kg/(m^2).  General Appearance: Casual  Eye Contact::  Fair  Speech:  Clear and Coherent409  Volume:  Normal  Mood:  Euthymic  Affect:  Appropriate  Thought Process:  Goal Directed and Descriptions of Associations: Intact  Orientation:  Full (Time, Place, and Person)  Thought Content:  Logical  Suicidal Thoughts:  No  Homicidal Thoughts:  No  Memory:  Immediate;   Fair Recent;   Fair Remote;   Fair  Judgement:  Fair  Insight:  Fair  Psychomotor Activity:   Normal  Concentration:  Fair  Recall:  [employer]  Fund of Knowledge:Fair  Language: Fair  Akathisia:  No  Handed:  Right  AIMS (if indicated):     Assets:  Desire for Improvement  Sleep:  Number of Hours: 5.5  Cognition: WNL  ADL's:  Intact   Mental Status Per Nursing Assessment::   On Admission:  Suicidal ideation indicated by patient, Self-harm thoughts, Self-harm behaviors  Demographic Factors:  NA  Loss Factors: Financial problems/change in socioeconomic status  Historical Factors: Impulsivity  Risk Reduction Factors:   Positive social support  Continued Clinical Symptoms:  Alcohol/Substance Abuse/Dependencies Previous Psychiatric Diagnoses and Treatments  Cognitive Features That Contribute To Risk:  None    Suicide Risk:  Minimal: No identifiable suicidal ideation.  Patients presenting with no risk factors but with morbid ruminations; may be classified as minimal risk based on the severity of the depressive symptoms  Follow-up Information    [facility].   Specialty:  Behavioral Health   Why:  Appt on this date with Dr. Merlyn AlbertFred at 3:00PM. No earlier appts available but you can walk in between 8am-10am Monday through Friday to be seen sooner AND TO BE SCHEDULED FOR COUNSELING APPT. Thank you.    Contact information:   [address] [place] [address] [phone]       Plan Of Care/Follow-up recommendations:  Activity:  no restrictions Diet:  regular Tests:  as needed Other:  aftercare follow up as needed  Ronnel Zuercher, MD 06/27/2015, 11:18 AM

## 2015-06-27 NOTE — Progress Notes (Signed)
Discharge note: pt received both written and verbal discharge instructions. Pt verbalized understanding of discharge instructions. Pt agreed to f/u appt and med regimen. Pt received sample meds, prescriptions (taken to lobby by Pharm D) SRA, AVS and Transition record. Pt received belongings from room and locker. Pt safely discharged to the lobby.

## 2015-06-27 NOTE — Progress Notes (Signed)
  [facility] Discharge [facility] :  Will you be returning to the same living situation after discharge:  Yes,  home At discharge, do you have transportation home?: Yes,  boyfriend Do you have the ability to pay for your medications: Yes,  mental health  Release of information consent forms completed and submitted to medical records by CSW.  Patient to Follow up at: Follow-up Information    [facility].   Specialty:  Behavioral Health   Why:  Appt on this date with Dr. Merlyn AlbertFred at 3:00PM. No earlier appts available but you can walk in between 8am-10am Monday through Friday to be seen sooner AND TO BE SCHEDULED FOR COUNSELING APPT. Thank you.    Contact information:   [address] [place] [address] [phone]       Next level of care provider has [facility] Link:no  Safety Planning and Suicide Prevention discussed: Yes,  SPE completed with pt; Contact attempts made with pt's boyfriend and call back requested. Pt provided with Mobile Crisis information and SPE pamphlet. She was encouraged to share information with her support network/boyfriend.  Have you used any form of tobacco in the last 30 days? (Cigarettes, Smokeless Tobacco, Cigars, and/or Pipes): Yes  Has patient been referred to the Quitline?: Patient refused referral  Patient has been referred for addiction treatment: Yes  Smart, Lavanya Roa LCSW 06/27/2015, 11:05 AM

## 2015-06-27 NOTE — Plan of Care (Signed)
Problem: Safety: Goal: Periods of time without injury will increase Outcome: Progressing Pt has not harmed self tonight.  She verbally contracts for safety.      

## 2015-06-27 NOTE — Progress Notes (Signed)
Recreation Therapy Notes  Date: 06.26.2017 Time: 9:30am Location: [address] Group Room   Group Topic: Stress Management  Goal Area(s) Addresses:  Patient will actively participate in stress management techniques presented during session.   Behavioral Response: Did not attend.   Kyllian Clingerman L Watt Geiler, LRT/CTRS        Marua Qin L 06/27/2015 10:16 AM 
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
## 2017-07-01 ENCOUNTER — Other Ambulatory Visit: Payer: Self-pay

## 2017-07-01 ENCOUNTER — Encounter (HOSPITAL_COMMUNITY): Payer: Self-pay | Admitting: Obstetrics and Gynecology

## 2017-07-01 ENCOUNTER — Emergency Department (HOSPITAL_COMMUNITY)
Admission: EM | Admit: 2017-07-01 | Discharge: 2017-07-01 | Discharge status: Against Medical Advice | Payer: Self-pay | Attending: Emergency Medicine | Admitting: Emergency Medicine

## 2017-07-01 ENCOUNTER — Emergency Department (HOSPITAL_COMMUNITY): Payer: Self-pay

## 2017-07-01 DIAGNOSIS — Z79899 Other long term (current) drug therapy: Secondary | ICD-10-CM | POA: Insufficient documentation

## 2017-07-01 DIAGNOSIS — Y9389 Activity, other specified: Secondary | ICD-10-CM | POA: Insufficient documentation

## 2017-07-01 DIAGNOSIS — F17200 Nicotine dependence, unspecified, uncomplicated: Secondary | ICD-10-CM | POA: Insufficient documentation

## 2017-07-01 DIAGNOSIS — R079 Chest pain, unspecified: Secondary | ICD-10-CM | POA: Insufficient documentation

## 2017-07-01 DIAGNOSIS — Y999 Unspecified external cause status: Secondary | ICD-10-CM | POA: Insufficient documentation

## 2017-07-01 DIAGNOSIS — Y929 Unspecified place or not applicable: Secondary | ICD-10-CM | POA: Insufficient documentation

## 2017-07-01 MED ORDER — HYDROXYZINE HCL 25 MG PO TABS: 25.0000 mg | ORAL_TABLET | Freq: Once | ORAL | Status: DC
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
## 2017-07-01 NOTE — ED Notes (Signed)
Patient is agitated and flopping around on the stretcher-unable to lie still
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
## 2017-07-01 NOTE — ED Notes (Signed)
Bed: [facility] Expected date:  Expected time:  Means of arrival:  Comments: 

## 2017-07-01 NOTE — ED Notes (Addendum)
Patient yelling and cursing when offered vistaril and said,"I'm not takin that shit, it's like taking candy. I want my fuckin xanax 75 mg and my seroquel!!!" I said no. She said,"I'm fucking leaving then!"

## 2017-07-20 ENCOUNTER — Encounter (HOSPITAL_COMMUNITY): Payer: Self-pay | Admitting: *Deleted

## 2017-07-20 ENCOUNTER — Emergency Department (HOSPITAL_COMMUNITY): Payer: Self-pay

## 2017-07-20 ENCOUNTER — Emergency Department (HOSPITAL_COMMUNITY)
Admission: EM | Admit: 2017-07-20 | Discharge: 2017-07-20 | Disposition: A | Discharge status: Home / Self Care | Payer: Self-pay | Attending: Emergency Medicine | Admitting: Emergency Medicine

## 2017-07-20 ENCOUNTER — Other Ambulatory Visit: Payer: Self-pay

## 2017-07-20 DIAGNOSIS — Z5329 Procedure and treatment not carried out because of patient's decision for other reasons: Secondary | ICD-10-CM | POA: Insufficient documentation

## 2017-07-20 DIAGNOSIS — R569 Unspecified convulsions: Secondary | ICD-10-CM | POA: Insufficient documentation

## 2017-07-20 DIAGNOSIS — F17200 Nicotine dependence, unspecified, uncomplicated: Secondary | ICD-10-CM | POA: Insufficient documentation

## 2017-07-20 DIAGNOSIS — R0789 Other chest pain: Secondary | ICD-10-CM | POA: Insufficient documentation

## 2017-07-20 DIAGNOSIS — Z79899 Other long term (current) drug therapy: Secondary | ICD-10-CM | POA: Insufficient documentation

## 2017-07-20 LAB — CBC
HEMATOCRIT: 45.1 % (ref 36.0–46.0)
Hemoglobin: 14.4 g/dL (ref 12.0–15.0)
MCH: 29.8 pg (ref 26.0–34.0)
MCHC: 31.9 g/dL (ref 30.0–36.0)
MCV: 93.2 fL (ref 78.0–100.0)
PLATELETS: 301 10*3/uL (ref 150–400)
RBC: 4.84 MIL/uL (ref 3.87–5.11)
RDW: 12.9 % (ref 11.5–15.5)
WBC: 5.8 10*3/uL (ref 4.0–10.5)

## 2017-07-20 LAB — BASIC METABOLIC PANEL
Anion gap: 12 (ref 5–15)
BUN: 6 mg/dL (ref 6–20)
CO2: 21 mmol/L — ABNORMAL LOW (ref 22–32)
CREATININE: 0.7 mg/dL (ref 0.44–1.00)
Calcium: 9.8 mg/dL (ref 8.9–10.3)
Chloride: 106 mmol/L (ref 98–111)
GFR calc Af Amer: >60 mL/min (ref >60)
GLUCOSE: 93 mg/dL (ref 70–99)
Potassium: 4.1 mmol/L (ref 3.5–5.1)
SODIUM: 139 mmol/L (ref 135–145)

## 2017-07-20 LAB — I-STAT BETA HCG BLOOD, ED (MC, WL, AP ONLY): I-stat hCG, quantitative: <5 m[IU]/mL (ref <5)

## 2017-07-20 LAB — TROPONIN I

## 2017-07-20 MED ORDER — HYDROXYZINE HCL 25 MG PO TABS
25.0000 mg | ORAL_TABLET | Freq: Once | ORAL | Status: DC
Start: 2017-07-20 — End: 2017-07-20

## 2017-07-20 NOTE — ED Notes (Addendum)
Pt requesting to leave AMA. MD Pickering notified. A&O x4 Pt states she does not want her Hydroxyzine.

## 2017-07-20 NOTE — ED Triage Notes (Signed)
The pt is c/o chest pain iv lt wrist
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
## 2017-07-20 NOTE — ED Triage Notes (Signed)
The pt arriv ed by [facility] from home ambulatory  She has seizures and a large marble plate fell onto her small dog and crushed it.  The dog is dead and that caused her to have a seizure.  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

## 2019-03-01 IMAGING — CR DG CHEST 2V
2 series · 2 of 2 positions shown · non-contrast
Comparison: None.

CLINICAL DATA: Seizure

EXAM:
CHEST - 2 VIEW

[chest lat]
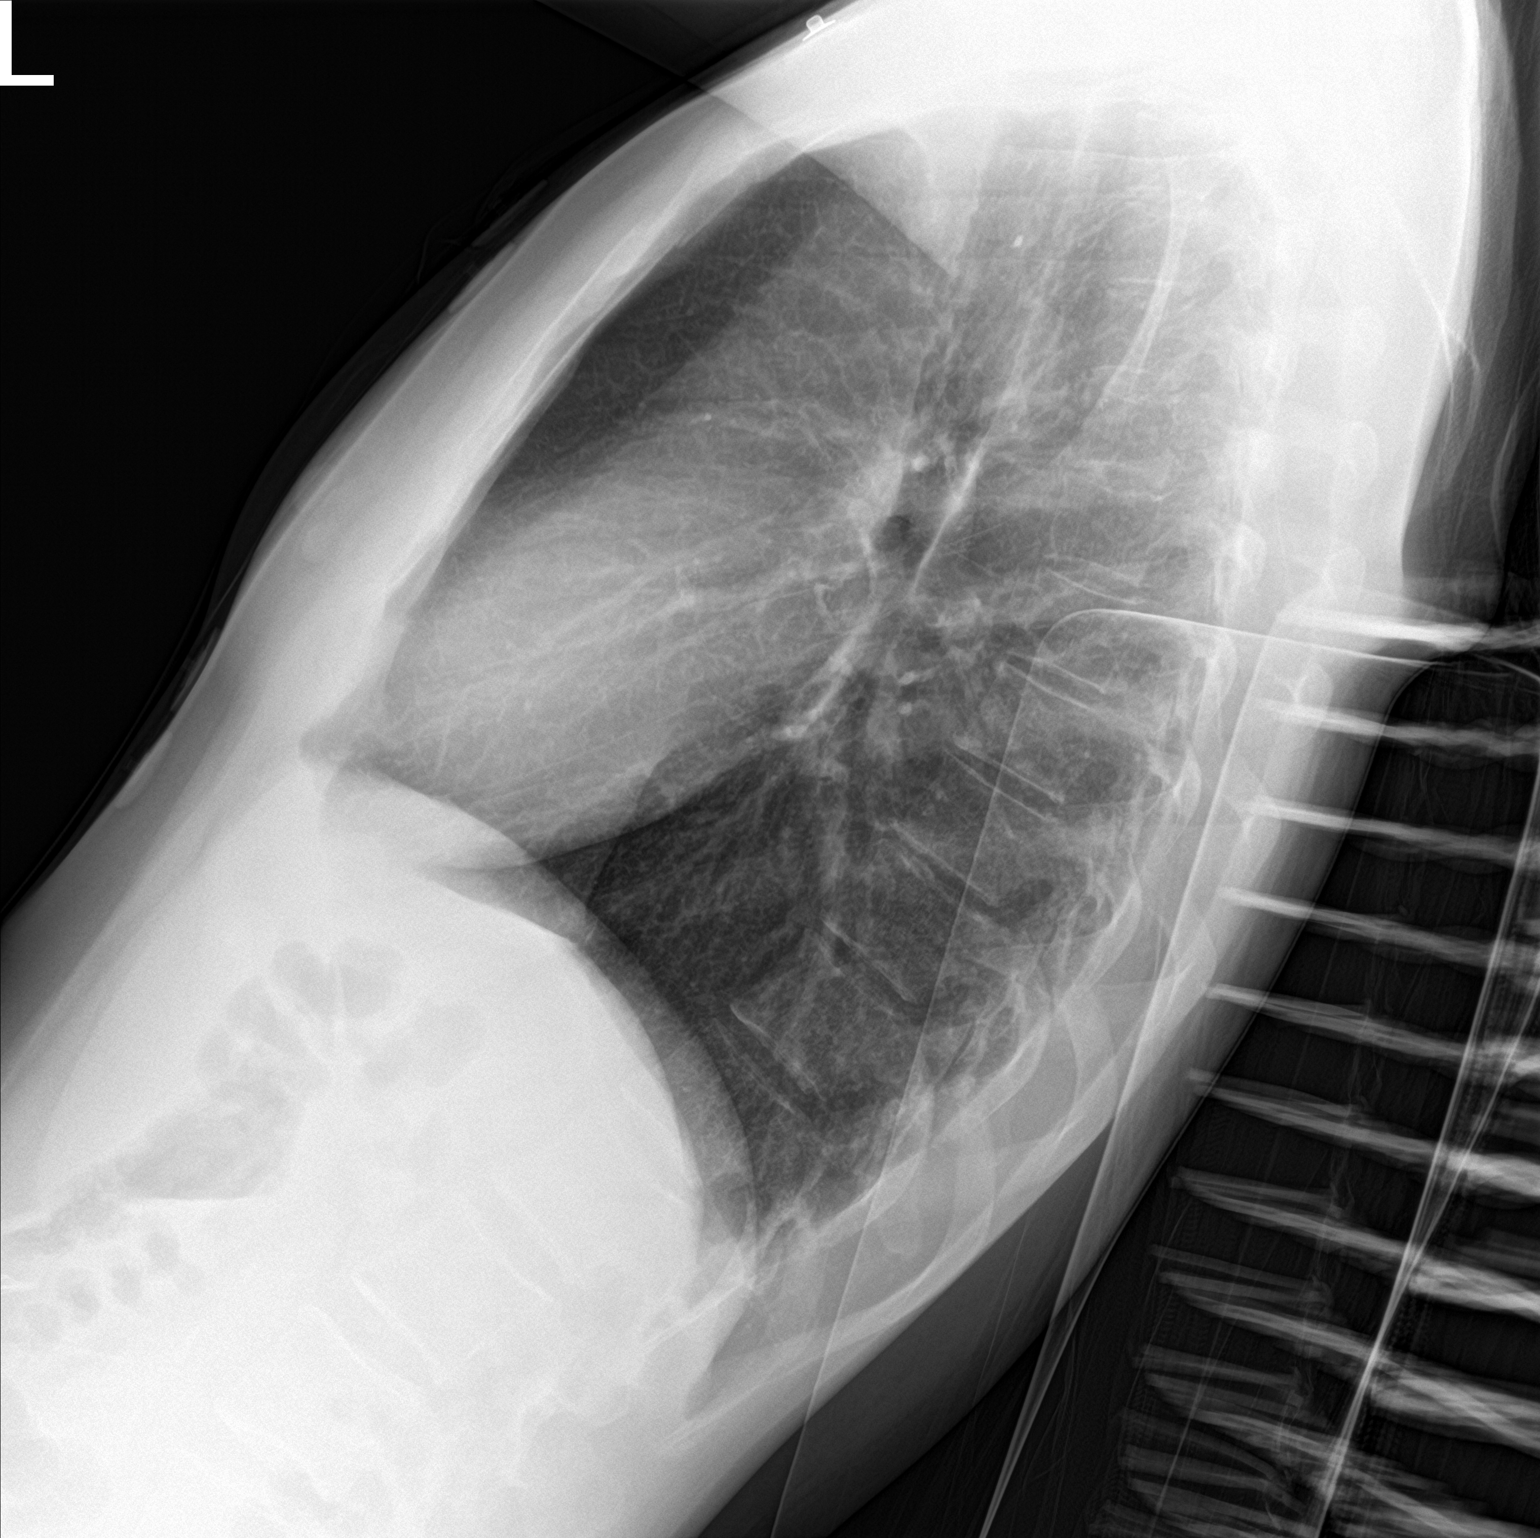

[chest ap]
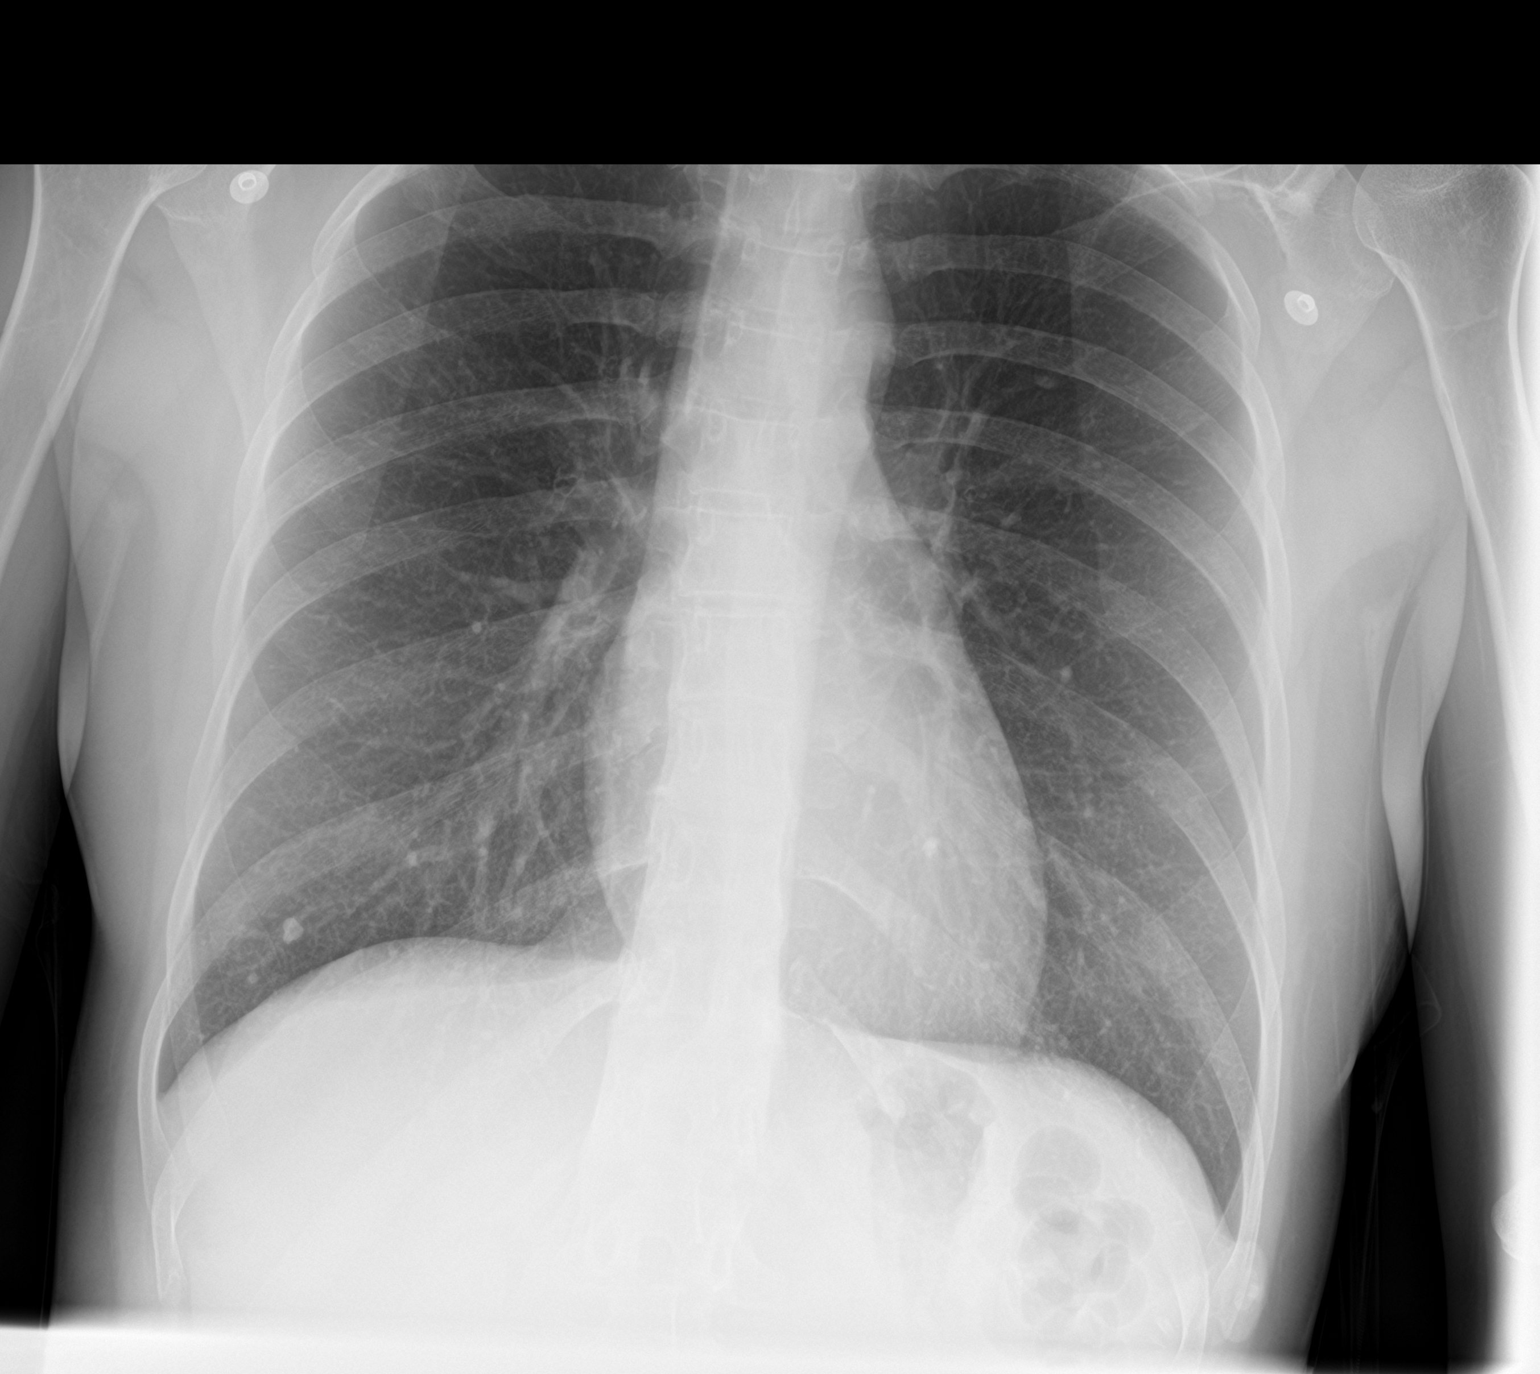

[2 of 2 positions shown; findings below may reference images not displayed]

FINDINGS: There are scattered small calcified granulomas, largest in the right
base. There is no edema or consolidation. Heart size and pulmonary
vascularity are normal. No adenopathy. No bone lesions. No
pneumothorax.
IMPRESSION: Small calcified granulomas.  No edema or consolidation.

## 2019-10-08 ENCOUNTER — Ambulatory Visit (HOSPITAL_COMMUNITY): Payer: No Typology Code available for payment source | Admitting: Psychiatry

## 2019-12-02 ENCOUNTER — Telehealth (INDEPENDENT_AMBULATORY_CARE_PROVIDER_SITE_OTHER): Payer: No Payment, Other | Admitting: Psychiatry

## 2019-12-02 ENCOUNTER — Other Ambulatory Visit: Payer: Self-pay

## 2019-12-02 ENCOUNTER — Encounter (HOSPITAL_COMMUNITY): Payer: Self-pay | Admitting: Psychiatry

## 2019-12-02 DIAGNOSIS — F319 Bipolar disorder, unspecified: Secondary | ICD-10-CM | POA: Diagnosis not present

## 2019-12-02 DIAGNOSIS — F1210 Cannabis abuse, uncomplicated: Secondary | ICD-10-CM

## 2019-12-02 DIAGNOSIS — F1020 Alcohol dependence, uncomplicated: Secondary | ICD-10-CM

## 2019-12-02 DIAGNOSIS — F419 Anxiety disorder, unspecified: Secondary | ICD-10-CM

## 2019-12-02 DIAGNOSIS — F4310 Post-traumatic stress disorder, unspecified: Secondary | ICD-10-CM

## 2019-12-02 DIAGNOSIS — F1420 Cocaine dependence, uncomplicated: Secondary | ICD-10-CM | POA: Diagnosis not present

## 2019-12-02 MED ORDER — QUETIAPINE FUMARATE 50 MG PO TABS: 50.0000 mg | ORAL_TABLET | Freq: Two times a day (BID) | ORAL | 1 refills | Status: DC

## 2019-12-02 MED ORDER — VENLAFAXINE HCL ER 37.5 MG PO CP24: ORAL_CAPSULE | ORAL | 1 refills | Status: DC

## 2019-12-02 MED ORDER — QUETIAPINE FUMARATE 300 MG PO TABS: 300.0000 mg | ORAL_TABLET | Freq: Every day | ORAL | 2 refills | Status: DC

## 2019-12-02 NOTE — Progress Notes (Signed)
BH MD/PA/NP OP Progress Note   Virtual Visit via Video Note  I connected with Kaitlyn Shepherd on 12/02/19 at  1:00 PM EST by a video enabled telemedicine application and verified that I am speaking with the correct person using two identifiers.  Location: Patient: Home Provider: Clinic   I discussed the limitations of evaluation and management by telemedicine and the availability of in person appointments. The patient expressed understanding and agreed to proceed.  I provided 25 minutes of non-face-to-face time during this encounter.    12/02/2019 2:03 PM Kaitlyn Shepherd  MRN:  [id number]  Chief Complaint:  " I need to be on Xanaxes."  HPI: This is a 44 year old female with history of bipolar disorder, cocaine, alcohol and cannabis use disorder.  As per EMR she also has history of stimulant dependence in the past.  She was being managed at [facility] and was being prescribed Effexor XR 37.5 mg 3 capsules in the morning, Seroquel 50 mg twice a day and 300 mg at bedtime.  She is also being prescribed gabapentin 600 mg 3 times a day. As per EMR she has been prescribed Vistaril in the past as well.  Today, patient was noted to be restless and had a hard time staying still during the virtual session.  She kept moving her here and neck around.  She stated that Effexor helps to some extent but the Seroquel daytime doses do not help with her anxiety at all.  She stated that, " Vistaril, BuSpar, Ativan send me straight to a panic attack."  She stated that the only medicine that has helped her is, " Administrator, sports informed her that given her history of substance abuse in the past Xanax would not be an appropriate option for her and that writer would not be prescribing that to her.  She asked the exact reason why.  Writer explained to her again due to her history of substance addiction.  At that point she replied that she was diagnosed with severe PTSD back in 2017 after her son killed himself. She  then asked the writer if they would be willing to prescribe her with Valium.  Writer asked gave the same explanation regarding the addictive potential of Valium given her history of substance abuse. Patient then asked if the writer was a Garment/textile technologist and if she was a Garment/textile technologist and Vicodin she prescribed Xanax or Valium to her. She stated that she was drinking heavily because of her son ending his life by himself and then she asked the writer, " Wouldn't you be drinking heavily if your son killed himself?" Writer offered retrial of hydroxyzine however patient stated she did not want to do that. Writer clarified about gabapentin and she reported that she did not find it to be helpful so she stopped taking it many months ago as she did not want to waste her money on something that was not helping her.  She then went on to say that her boyfriend is a "junkie".   She stated that only medicine that can help her would be Xanax or Valium. Patient was extremely fixated on being prescribed with a benzodiazepine.   Visit Diagnosis:    ICD-10-CM   1. Bipolar I disorder (HCC)  F31.9 QUEtiapine (SEROQUEL) 300 MG tablet    venlafaxine XR (EFFEXOR XR) 37.5 MG 24 hr capsule    QUEtiapine (SEROQUEL) 50 MG tablet  2. Alcohol use disorder, severe, dependence (HCC)  F10.20   3. Cannabis use disorder, mild, abuse  F12.10   4. Cocaine use disorder, moderate, dependence (HCC)  F14.20   5. Anxiety  F41.9 QUEtiapine (SEROQUEL) 300 MG tablet    venlafaxine XR (EFFEXOR XR) 37.5 MG 24 hr capsule    QUEtiapine (SEROQUEL) 50 MG tablet  6. PTSD (post-traumatic stress disorder)  F43.10     Past Psychiatric History: Hx of polysubstance abuse including alcohol, cannabis, cocaine, stimulants.  Has been diagnosed with PTSD and bipolar disorder in the past.  Past Medical History:  Past Medical History:  Diagnosis Date  . Anxiety   . Bipolar 1 disorder (HCC)   . Migraine   . Panic attacks   . Seizures  (HCC)     Past Surgical History:  Procedure Laterality Date  . TUBAL LIGATION      Family Psychiatric History: Son committed suicide  Family History: History reviewed. No pertinent family history.  Social History:  Social History   Socioeconomic History  . Marital status: Divorced    Spouse name: Not on file  . Number of children: Not on file  . Years of education: Not on file  . Highest education level: Not on file  Occupational History  . Not on file  Tobacco Use  . Smoking status: Current Every Day Smoker  . Smokeless tobacco: Never Used  Vaping Use  . Vaping Use: Never used  Substance and Sexual Activity  . Alcohol use: Yes    Comment: heavy  . Drug use: Yes    Types: Marijuana, Methamphetamines  . Sexual activity: Yes  Other Topics Concern  . Not on file  Social History Narrative  . Not on file   Social Determinants of Health   Financial Resource Strain:   . Difficulty of Paying Living Expenses: Not on file  Food Insecurity:   . Worried About Programme researcher, broadcasting/film/video in the Last Year: Not on file  . Ran Out of Food in the Last Year: Not on file  Transportation Needs:   . Lack of Transportation (Medical): Not on file  . Lack of Transportation (Non-Medical): Not on file  Physical Activity:   . Days of Exercise per Week: Not on file  . Minutes of Exercise per Session: Not on file  Stress:   . Feeling of Stress : Not on file  Social Connections:   . Frequency of Communication with Friends and Family: Not on file  . Frequency of Social Gatherings with Friends and Family: Not on file  . Attends Religious Services: Not on file  . Active Member of Clubs or Organizations: Not on file  . Attends Banker Meetings: Not on file  . Marital Status: Not on file    Allergies:  Allergies  Allergen Reactions  . Ativan [Lorazepam] Other (See Comments)    Pt reports this medication gives her panic attacks  . Buspar [Buspirone] Other (See Comments)    Pt  reports panic attack after taking.  Leodis [place] [Propoxyphene N-Acetaminophen] Itching  . Lithium Other (See Comments)    Abnormal bloodwork  . Toradol [Ketorolac Tromethamine] Rash    Itching     Metabolic Disorder Labs: Lab Results  Component Value Date   HGBA1C 5.0 06/25/2015   MPG 97 06/25/2015   Lab Results  Component Value Date   PROLACTIN 35.5 (H) 06/25/2015   Lab Results  Component Value Date   CHOL 189 06/25/2015   TRIG 207 (H) 06/25/2015   HDL 38 (L) 06/25/2015   CHOLHDL 5.0 06/25/2015   VLDL 41 (H)  06/25/2015   LDLCALC 110 (H) 06/25/2015   Lab Results  Component Value Date   TSH 2.524 06/25/2015    Therapeutic Level Labs: No results found for: LITHIUM No results found for: VALPROATE No components found for:  CBMZ  Current Medications: Current Outpatient Medications  Medication Sig Dispense Refill  . nicotine (NICODERM CQ - DOSED IN MG/24 HOURS) 21 mg/24hr patch Place 1 patch (21 mg total) onto the skin daily. 28 patch 0  . QUEtiapine (SEROQUEL) 300 MG tablet Take 1 tablet (300 mg total) by mouth at bedtime. 30 tablet 2  . QUEtiapine (SEROQUEL) 50 MG tablet Take 1 tablet (50 mg total) by mouth 2 (two) times daily. 60 tablet 1  . venlafaxine XR (EFFEXOR XR) 37.5 MG 24 hr capsule Take 3 capsules daily 90 capsule 1   No current facility-administered medications for this visit.     Psychiatric Specialty Exam: Review of Systems  Last menstrual period 01/08/2013.There is no height or weight on file to calculate BMI.  General Appearance: Fairly Groomed  Eye Contact:  Good  Speech:  Clear and Coherent and Normal Rate  Volume:  Normal  Mood:  Anxious  Affect:  Congruent  Thought Process:  Goal Directed and Descriptions of Associations: Intact  Orientation:  Full (Time, Place, and Person)  Thought Content: Fixated on benzodiazepine prescription   Suicidal Thoughts:  No  Homicidal Thoughts:  No  Memory:  Immediate;   Good Recent;   Good  Judgement:  Fair   Insight:  Poor  Psychomotor Activity:  Restlessness and Kept moving around, could not keep the phone screen straight  Concentration:  Concentration: Good and Attention Span: Good  Recall:  Good  Fund of Knowledge: Good  Language: Good  Akathisia:  Negative  Handed:  Right  AIMS (if indicated): 0  Assets:  Communication Skills Desire for Improvement Financial Resources/Insurance Housing Social Support  ADL's:  Intact  Cognition: WNL  Sleep:  Fair   Screenings: AIMS     [facility]  AIMS Total Score 0    AUDIT     [facility]  Alcohol Use Disorder Identification Test Final Score (AUDIT) 1       Assessment and Plan: Patient was extremely fixated on being prescribed benzodiazepine preferably Xanax or Valium.  Writer offered benzodiazepine options for anxiety however she kept stating that nothing helps and in fact been on benzodiazepine medications caused her to have a panic attack.  She also did not want to try a higher dose of gabapentin.  She was agreeable to continuing the current doses of Seroquel and Effexor.  Writer contacted her pharmacy to verify her doses. Potential side effects of medication and risks vs benefits of treatment vs non-treatment were explained and discussed. All questions were answered.  1. Bipolar I disorder (HCC)  - QUEtiapine (SEROQUEL) 300 MG tablet; Take 1 tablet (300 mg total) by mouth at bedtime.  Dispense: 30 tablet; Refill: 2 - venlafaxine XR (EFFEXOR XR) 37.5 MG 24 hr capsule; Take 3 capsules daily  Dispense: 90 capsule; Refill: 1 - QUEtiapine (SEROQUEL) 50 MG tablet; Take 1 tablet (50 mg total) by mouth 2 (two) times daily.  Dispense: 60 tablet; Refill: 1  2. Alcohol use disorder, severe, dependence (HCC)   3. Cannabis use disorder, mild, abuse   4. Cocaine use disorder, moderate, dependence  (HCC)   5. Anxiety  - QUEtiapine (SEROQUEL)  300 MG tablet; Take 1 tablet (300 mg total) by mouth at bedtime.  Dispense: 30 tablet; Refill: 2 - venlafaxine XR (EFFEXOR XR) 37.5 MG 24 hr capsule; Take 3 capsules daily  Dispense: 90 capsule; Refill: 1 - QUEtiapine (SEROQUEL) 50 MG tablet; Take 1 tablet (50 mg total) by mouth 2 (two) times daily.  Dispense: 60 tablet; Refill: 1  6. PTSD (post-traumatic stress disorder)  -venlafaxine XR (EFFEXOR XR) 37.5 MG 24 hr capsule; Take 3 capsules daily  Dispense: 90 capsule; Refill: 1  Continue same medication regimen. Follow up in 3 months.   Zena Amos, MD 12/02/2019, 2:03 PM

## 2020-02-24 ENCOUNTER — Telehealth (HOSPITAL_COMMUNITY): Payer: Self-pay | Admitting: *Deleted

## 2020-02-24 DIAGNOSIS — F419 Anxiety disorder, unspecified: Secondary | ICD-10-CM

## 2020-02-24 DIAGNOSIS — F319 Bipolar disorder, unspecified: Secondary | ICD-10-CM

## 2020-02-24 NOTE — Telephone Encounter (Signed)
Rx REQUESTED REFILL QUEtiapine (SEROQUEL) 50 MG tablet 60 tablet 1 12/02/2019

## 2020-02-25 MED ORDER — QUETIAPINE FUMARATE 50 MG PO TABS: 50.0000 mg | ORAL_TABLET | Freq: Two times a day (BID) | ORAL | 1 refills | Status: DC

## 2020-02-25 NOTE — Telephone Encounter (Signed)
Done

## 2020-02-25 NOTE — Addendum Note (Signed)
Addended by: Zena Amos on: 02/25/2020 06:21 AM   Modules accepted: Orders

## 2020-03-01 ENCOUNTER — Telehealth (HOSPITAL_COMMUNITY): Payer: No Typology Code available for payment source | Admitting: Psychiatry

## 2020-03-01 ENCOUNTER — Other Ambulatory Visit: Payer: Self-pay

## 2020-03-31 ENCOUNTER — Telehealth (HOSPITAL_COMMUNITY): Payer: Self-pay | Admitting: *Deleted

## 2020-03-31 DIAGNOSIS — F319 Bipolar disorder, unspecified: Secondary | ICD-10-CM

## 2020-03-31 DIAGNOSIS — F419 Anxiety disorder, unspecified: Secondary | ICD-10-CM

## 2020-03-31 MED ORDER — QUETIAPINE FUMARATE 300 MG PO TABS: 300.0000 mg | ORAL_TABLET | Freq: Every day | ORAL | 2 refills | Status: DC

## 2020-03-31 MED ORDER — QUETIAPINE FUMARATE 50 MG PO TABS: 50.0000 mg | ORAL_TABLET | Freq: Two times a day (BID) | ORAL | 1 refills | Status: DC

## 2020-03-31 NOTE — Addendum Note (Signed)
Addended by: Zena Amos on: 03/31/2020 09:57 AM   Modules accepted: Orders

## 2020-03-31 NOTE — Telephone Encounter (Signed)
Request from [facility] for patients Seroquel 300 mg. She should be out of it. Will bring this to Dr Magdalen Spatz attention for renewal.

## 2020-04-07 ENCOUNTER — Inpatient Hospital Stay (HOSPITAL_COMMUNITY)
Admission: AD | Admit: 2020-04-07 | Discharge: 2020-04-09 | DRG: 885 | Disposition: A | Discharge status: Home / Self Care | Payer: Federal, State, Local not specified - Other | Source: Intra-hospital | Attending: Psychiatry | Admitting: Psychiatry

## 2020-04-07 ENCOUNTER — Encounter (HOSPITAL_COMMUNITY): Payer: Self-pay | Admitting: Psychiatry

## 2020-04-07 ENCOUNTER — Encounter (HOSPITAL_COMMUNITY): Payer: Self-pay | Admitting: Emergency Medicine

## 2020-04-07 ENCOUNTER — Emergency Department (HOSPITAL_COMMUNITY)
Admission: EM | Admit: 2020-04-07 | Discharge: 2020-04-07 | Disposition: A | Discharge status: Other Acute Inpatient Hospital | Payer: Self-pay | Attending: Emergency Medicine | Admitting: Emergency Medicine

## 2020-04-07 ENCOUNTER — Other Ambulatory Visit: Payer: Self-pay

## 2020-04-07 DIAGNOSIS — G4700 Insomnia, unspecified: Secondary | ICD-10-CM | POA: Diagnosis present

## 2020-04-07 DIAGNOSIS — Z79899 Other long term (current) drug therapy: Secondary | ICD-10-CM | POA: Diagnosis not present

## 2020-04-07 DIAGNOSIS — Z9851 Tubal ligation status: Secondary | ICD-10-CM | POA: Diagnosis not present

## 2020-04-07 DIAGNOSIS — F1910 Other psychoactive substance abuse, uncomplicated: Secondary | ICD-10-CM

## 2020-04-07 DIAGNOSIS — F319 Bipolar disorder, unspecified: Secondary | ICD-10-CM | POA: Insufficient documentation

## 2020-04-07 DIAGNOSIS — Y906 Blood alcohol level of 120-199 mg/100 ml: Secondary | ICD-10-CM | POA: Insufficient documentation

## 2020-04-07 DIAGNOSIS — Z20822 Contact with and (suspected) exposure to covid-19: Secondary | ICD-10-CM | POA: Diagnosis present

## 2020-04-07 DIAGNOSIS — F1920 Other psychoactive substance dependence, uncomplicated: Secondary | ICD-10-CM | POA: Diagnosis not present

## 2020-04-07 DIAGNOSIS — Z818 Family history of other mental and behavioral disorders: Secondary | ICD-10-CM | POA: Diagnosis not present

## 2020-04-07 DIAGNOSIS — Z888 Allergy status to other drugs, medicaments and biological substances status: Secondary | ICD-10-CM | POA: Diagnosis not present

## 2020-04-07 DIAGNOSIS — F4310 Post-traumatic stress disorder, unspecified: Secondary | ICD-10-CM | POA: Diagnosis present

## 2020-04-07 DIAGNOSIS — F17200 Nicotine dependence, unspecified, uncomplicated: Secondary | ICD-10-CM | POA: Diagnosis present

## 2020-04-07 DIAGNOSIS — F410 Panic disorder [episodic paroxysmal anxiety] without agoraphobia: Secondary | ICD-10-CM | POA: Diagnosis present

## 2020-04-07 DIAGNOSIS — F1994 Other psychoactive substance use, unspecified with psychoactive substance-induced mood disorder: Secondary | ICD-10-CM | POA: Diagnosis not present

## 2020-04-07 DIAGNOSIS — R45851 Suicidal ideations: Secondary | ICD-10-CM | POA: Diagnosis present

## 2020-04-07 DIAGNOSIS — F314 Bipolar disorder, current episode depressed, severe, without psychotic features: Secondary | ICD-10-CM | POA: Diagnosis present

## 2020-04-07 DIAGNOSIS — F1020 Alcohol dependence, uncomplicated: Secondary | ICD-10-CM | POA: Diagnosis present

## 2020-04-07 DIAGNOSIS — F3163 Bipolar disorder, current episode mixed, severe, without psychotic features: Secondary | ICD-10-CM

## 2020-04-07 LAB — COMPREHENSIVE METABOLIC PANEL
ALT: 20 U/L (ref 0–44)
AST: 29 U/L (ref 15–41)
Albumin: 4.9 g/dL (ref 3.5–5.0)
Alkaline Phosphatase: 96 U/L (ref 38–126)
Anion gap: 14 (ref 5–15)
BUN: 10 mg/dL (ref 6–20)
CO2: 20 mmol/L — ABNORMAL LOW (ref 22–32)
Calcium: 9.3 mg/dL (ref 8.9–10.3)
Chloride: 108 mmol/L (ref 98–111)
Creatinine, Ser: 1.09 mg/dL — ABNORMAL HIGH (ref 0.44–1.00)
GFR, Estimated: >60 mL/min (ref >60)
Glucose, Bld: 99 mg/dL (ref 70–99)
Potassium: 3 mmol/L — ABNORMAL LOW (ref 3.5–5.1)
Sodium: 142 mmol/L (ref 135–145)
Total Bilirubin: 0.6 mg/dL (ref 0.3–1.2)
Total Protein: 8.4 g/dL — ABNORMAL HIGH (ref 6.5–8.1)

## 2020-04-07 LAB — RAPID URINE DRUG SCREEN, HOSP PERFORMED
Amphetamines: POSITIVE — AB
Barbiturates: NOT DETECTED
Benzodiazepines: POSITIVE — AB
Cocaine: NOT DETECTED
Opiates: POSITIVE — AB
Tetrahydrocannabinol: POSITIVE — AB

## 2020-04-07 LAB — CBC WITH DIFFERENTIAL/PLATELET
Abs Immature Granulocytes: 0.09 10*3/uL — ABNORMAL HIGH (ref 0.00–0.07)
Basophils Absolute: 0.1 10*3/uL (ref 0.0–0.1)
Basophils Relative: 0 %
Eosinophils Absolute: 0.1 10*3/uL (ref 0.0–0.5)
Eosinophils Relative: 1 %
HCT: 41.1 % (ref 36.0–46.0)
Hemoglobin: 13.8 g/dL (ref 12.0–15.0)
Immature Granulocytes: 1 %
Lymphocytes Relative: 18 %
Lymphs Abs: 2.9 10*3/uL (ref 0.7–4.0)
MCH: 30 pg (ref 26.0–34.0)
MCHC: 33.6 g/dL (ref 30.0–36.0)
MCV: 89.3 fL (ref 80.0–100.0)
Monocytes Absolute: 0.9 10*3/uL (ref 0.1–1.0)
Monocytes Relative: 5 %
Neutro Abs: 11.6 10*3/uL — ABNORMAL HIGH (ref 1.7–7.7)
Neutrophils Relative %: 75 %
Platelets: 366 10*3/uL (ref 150–400)
RBC: 4.6 MIL/uL (ref 3.87–5.11)
RDW: 13.4 % (ref 11.5–15.5)
WBC: 15.6 10*3/uL — ABNORMAL HIGH (ref 4.0–10.5)
nRBC: 0 % (ref 0.0–0.2)

## 2020-04-07 LAB — RESP PANEL BY RT-PCR (FLU A&B, COVID) ARPGX2
Influenza A by PCR: NEGATIVE
Influenza B by PCR: NEGATIVE
SARS Coronavirus 2 by RT PCR: NEGATIVE

## 2020-04-07 LAB — I-STAT BETA HCG BLOOD, ED (MC, WL, AP ONLY): I-stat hCG, quantitative: <5 m[IU]/mL (ref <5)

## 2020-04-07 LAB — ACETAMINOPHEN LEVEL: Acetaminophen (Tylenol), Serum: <10 ug/mL — ABNORMAL LOW (ref 10–30)

## 2020-04-07 LAB — ETHANOL: Alcohol, Ethyl (B): 169 mg/dL — ABNORMAL HIGH (ref <10)

## 2020-04-07 LAB — SALICYLATE LEVEL: Salicylate Lvl: <7 mg/dL — ABNORMAL LOW (ref 7.0–30.0)

## 2020-04-07 MED ORDER — DICYCLOMINE HCL 20 MG PO TABS
20.0000 mg | ORAL_TABLET | Freq: Four times a day (QID) | ORAL | Status: DC | PRN
Start: 2020-04-07 — End: 2020-04-09

## 2020-04-07 MED ORDER — HYDROXYZINE HCL 25 MG PO TABS
25.0000 mg | ORAL_TABLET | Freq: Four times a day (QID) | ORAL | Status: DC | PRN
Start: 2020-04-07 — End: 2020-04-09
  Filled 2020-04-07: qty 1
  Filled 2020-04-07: qty 10

## 2020-04-07 MED ORDER — VENLAFAXINE HCL ER 75 MG PO CP24
75.0000 mg | ORAL_CAPSULE | Freq: Every day | ORAL | Status: DC
  Administered 2020-04-08 – 2020-04-09 (×2): 75 mg via ORAL
  Filled 2020-04-07 (×4): qty 1

## 2020-04-07 MED ORDER — OLANZAPINE 10 MG IM SOLR: 5.0000 mg | Freq: Two times a day (BID) | INTRAMUSCULAR | Status: DC | PRN

## 2020-04-07 MED ORDER — CLONIDINE HCL 0.1 MG PO TABS
0.1000 mg | ORAL_TABLET | Freq: Four times a day (QID) | ORAL | Status: AC
  Administered 2020-04-08 (×4): 0.1 mg via ORAL
  Filled 2020-04-07 (×8): qty 1

## 2020-04-07 MED ORDER — OLANZAPINE 10 MG PO TBDP: 10.0000 mg | ORAL_TABLET | Freq: Three times a day (TID) | ORAL | Status: DC | PRN

## 2020-04-07 MED ORDER — ZIPRASIDONE MESYLATE 20 MG IM SOLR: 20.0000 mg | Freq: Four times a day (QID) | INTRAMUSCULAR | Status: DC | PRN

## 2020-04-07 MED ORDER — NICOTINE 21 MG/24HR TD PT24
21.0000 mg | MEDICATED_PATCH | Freq: Every day | TRANSDERMAL | Status: DC
  Administered 2020-04-08 – 2020-04-09 (×2): 21 mg via TRANSDERMAL
  Filled 2020-04-07 (×4): qty 1

## 2020-04-07 MED ORDER — LORAZEPAM 1 MG PO TABS: 1.0000 mg | ORAL_TABLET | ORAL | Status: DC | PRN

## 2020-04-07 MED ORDER — ONDANSETRON 4 MG PO TBDP: 4.0000 mg | ORAL_TABLET | Freq: Four times a day (QID) | ORAL | Status: DC | PRN

## 2020-04-07 MED ORDER — LOPERAMIDE HCL 2 MG PO CAPS: 2.0000 mg | ORAL_CAPSULE | ORAL | Status: DC | PRN

## 2020-04-07 MED ORDER — MAGNESIUM HYDROXIDE 400 MG/5ML PO SUSP: 30.0000 mL | Freq: Every day | ORAL | Status: DC | PRN

## 2020-04-07 MED ORDER — FOLIC ACID 1 MG PO TABS
1.0000 mg | ORAL_TABLET | Freq: Every day | ORAL | Status: DC
  Administered 2020-04-08 – 2020-04-09 (×2): 1 mg via ORAL
  Filled 2020-04-07 (×4): qty 1

## 2020-04-07 MED ORDER — ALUM & MAG HYDROXIDE-SIMETH 200-200-20 MG/5ML PO SUSP: 30.0000 mL | ORAL | Status: DC | PRN

## 2020-04-07 MED ORDER — POTASSIUM CHLORIDE CRYS ER 20 MEQ PO TBCR
40.0000 meq | EXTENDED_RELEASE_TABLET | Freq: Once | ORAL | Status: AC
  Administered 2020-04-07: 40 meq via ORAL
  Filled 2020-04-07: qty 2

## 2020-04-07 MED ORDER — OLANZAPINE 10 MG PO TBDP
10.0000 mg | ORAL_TABLET | Freq: Three times a day (TID) | ORAL | Status: DC | PRN
  Administered 2020-04-07: 10 mg via ORAL
  Filled 2020-04-07: qty 1

## 2020-04-07 MED ORDER — LORAZEPAM 2 MG/ML IJ SOLN
1.0000 mg | Freq: Once | INTRAMUSCULAR | Status: AC
  Administered 2020-04-07: 1 mg via INTRAVENOUS
  Filled 2020-04-07: qty 1

## 2020-04-07 MED ORDER — CLONIDINE HCL 0.1 MG PO TABS: 0.1000 mg | ORAL_TABLET | Freq: Every day | ORAL | Status: DC

## 2020-04-07 MED ORDER — THIAMINE HCL 100 MG PO TABS
100.0000 mg | ORAL_TABLET | Freq: Every day | ORAL | Status: DC
  Administered 2020-04-08 – 2020-04-09 (×2): 100 mg via ORAL
  Filled 2020-04-07 (×4): qty 1

## 2020-04-07 MED ORDER — CLONIDINE HCL 0.1 MG PO TABS
0.1000 mg | ORAL_TABLET | ORAL | Status: DC
  Administered 2020-04-09: 0.1 mg via ORAL
  Filled 2020-04-07 (×3): qty 1

## 2020-04-07 MED ORDER — STERILE WATER FOR INJECTION IJ SOLN
INTRAMUSCULAR | Status: AC
  Filled 2020-04-07: qty 10

## 2020-04-07 MED ORDER — DIPHENHYDRAMINE HCL 50 MG/ML IJ SOLN: 50.0000 mg | Freq: Two times a day (BID) | INTRAMUSCULAR | Status: DC | PRN

## 2020-04-07 MED ORDER — CHLORDIAZEPOXIDE HCL 25 MG PO CAPS: 25.0000 mg | ORAL_CAPSULE | Freq: Four times a day (QID) | ORAL | Status: DC | PRN

## 2020-04-07 MED ORDER — METHOCARBAMOL 500 MG PO TABS: 500.0000 mg | ORAL_TABLET | Freq: Three times a day (TID) | ORAL | Status: DC | PRN

## 2020-04-07 MED ORDER — ZIPRASIDONE MESYLATE 20 MG IM SOLR
20.0000 mg | INTRAMUSCULAR | Status: DC | PRN
  Filled 2020-04-07: qty 20

## 2020-04-07 NOTE — H&P (Signed)
Psychiatric Admission Assessment Adult  Patient Identification: Kaitlyn Shepherd MRN:  [id number] Date of Evaluation:  04/07/2020 Chief Complaint:  Bipolar affective disorder, depressed, severe (HCC) [F31.4] Principal Diagnosis: <principal problem not specified> Diagnosis:  Active Problems:   Bipolar affective disorder, depressed, severe (HCC)  History of Present Illness: Patient is seen and examined.  Patient is a 45 year old female with a past psychiatric history significant for bipolar disorder as well as polysubstance use disorders who presented to the [facility] emergency department on 04/07/2020 via EMS from home.  The patient was apparently very erratic, and the patient stated that she was upset over this being the date of the anniversary of her son's suicide.  She had apparently taken an unknown amount of Xanax, alcohol, methamphetamines.  EMS gave the patient 5 mg of midazolam as well as 5 mg Haldol secondary to her agitation.  The patient had been placed in involuntary commitment by the emergency room physician after the patient had attempted to elope from the emergency department.  The evaluation by the comprehensive clinical assessment team was vague secondary to the patient's sedation.  She was noted to be drowsy but cooperative.  She stated that yesterday was the fifth anniversary of the death of her son by suicide.  She admitted to drinking too much alcohol last night.  Although her drug screen had the presence of benzodiazepines, opiates, amphetamines and marijuana she denied having used any methamphetamines or opiates.  Her blood alcohol on admission was 169.  She was transferred to our facility for evaluation and stabilization.  Her examination today still remains rather limited.  She is laying in the bed, and was sleeping comfortably.  She woke up enough to tell me about the anniversary of the death of her son.  She did also state that she was sweating.  She is followed  in the outpatient clinic at the behavioral health urgent care center by Dr. Evelene Croon.  The patient stated today that she had been compliant with her medication she was receiving from Dr. Evelene Croon.  Her last video visit was on 12/02/2019.  Her medications at that time included venlafaxine extended release 37.5 mg 3 capsules a day, Seroquel 300 mg p.o. nightly and 50 mg p.o. twice daily.  Review of the PMP database revealed no recent prescriptions for any controlled substances.  She was admitted to the hospital for evaluation and stabilization.  Associated Signs/Symptoms: Depression Symptoms:  depressed mood, anhedonia, insomnia, psychomotor agitation, fatigue, feelings of worthlessness/guilt, difficulty concentrating, hopelessness, suicidal thoughts without plan, anxiety, disturbed sleep, Duration of Depression Symptoms: Greater than two weeks  (Hypo) Manic Symptoms:  Impulsivity, Irritable Mood, Labiality of Mood, Anxiety Symptoms:  Excessive Worry, Psychotic Symptoms:  denied PTSD Symptoms: Had a traumatic exposure:  Loss of son 5 years ago by suicide Total Time spent with patient: 30 minutes  Past Psychiatric History: Patient has a reported history of bipolar disorder but also polysubstance use disorders including alcohol, cannabis, cocaine, methamphetamines, opiates and marijuana.  She also has been diagnosed with posttraumatic stress disorder in the past.  Her last psychiatric hospitalization in our system was in 2017.  Her diagnosis at that time included alcohol abuse with alcohol-induced mood disorder, cannabis abuse, alcohol dependence, alcohol intoxication.  Her hospitalization at that time was in June, and stated that she just wanted to be "with her son".  She wanted to kill her self.  Is the patient at risk to self? Yes.    Has the patient been a risk  to self in the past 6 months? Yes.    Has the patient been a risk to self within the distant past? Yes.    Is the patient a risk to  others? No.  Has the patient been a risk to others in the past 6 months? No.  Has the patient been a risk to others within the distant past? No.   Prior Inpatient Therapy:   Prior Outpatient Therapy:    Alcohol Screening:   Substance Abuse History in the last 12 months:  Yes.   Consequences of Substance Abuse: Medical Consequences:  The patient had basically alcohol, opiates, benzodiazepines, methamphetamines, and marijuana in her system. Previous Psychotropic Medications: Yes  Psychological Evaluations: Yes  Past Medical History:  Past Medical History:  Diagnosis Date  . Anxiety   . Bipolar 1 disorder (HCC)   . Migraine   . Panic attacks   . Seizures (HCC)     Past Surgical History:  Procedure Laterality Date  . TUBAL LIGATION     Family History: No family history on file. Family Psychiatric  History: Son apparently killed himself by hanging himself. Tobacco Screening:   Social History:  Social History   Substance and Sexual Activity  Alcohol Use Yes   Comment: heavy     Social History   Substance and Sexual Activity  Drug Use Yes  . Types: Marijuana, Methamphetamines    Additional Social History:                           Allergies:   Allergies  Allergen Reactions  . Ativan [Lorazepam] Other (See Comments)    Pt reports this medication gives her panic attacks  . Buspar [Buspirone] Other (See Comments)    Pt reports panic attack after taking.  Leodis Liverpool [Propoxyphene N-Acetaminophen] Itching  . Lithium Other (See Comments)    Abnormal bloodwork  . Toradol [Ketorolac Tromethamine] Rash    Itching    Lab Results:  Results for orders placed or performed during the hospital encounter of 04/07/20 (from the past 48 hour(s))  Comprehensive metabolic panel     Status: Abnormal   Collection Time: 04/07/20  1:13 AM  Result Value Ref Range   Sodium 142 135 - 145 mmol/L   Potassium 3.0 (L) 3.5 - 5.1 mmol/L   Chloride 108 98 - 111 mmol/L   CO2 20 (L)  22 - 32 mmol/L   Glucose, Bld 99 70 - 99 mg/dL    Comment: Glucose reference range applies only to samples taken after fasting for at least 8 hours.   BUN 10 6 - 20 mg/dL   Creatinine, Ser 6.04 (H) 0.44 - 1.00 mg/dL   Calcium 9.3 8.9 - 54.0 mg/dL   Total Protein 8.4 (H) 6.5 - 8.1 g/dL   Albumin 4.9 3.5 - 5.0 g/dL   AST 29 15 - 41 U/L   ALT 20 0 - 44 U/L   Alkaline Phosphatase 96 38 - 126 U/L   Total Bilirubin 0.6 0.3 - 1.2 mg/dL   GFR, Estimated >98 >11 mL/min    Comment: (NOTE) Calculated using the CKD-EPI Creatinine Equation (2021)    Anion gap 14 5 - 15    Comment: Performed at [facility], [address], [place], [place] [address]  Salicylate level     Status: Abnormal   Collection Time: 04/07/20  1:13 AM  Result Value Ref Range   Salicylate Lvl <7.0 (L) 7.0 -  30.0 mg/dL    Comment: Performed at [facility], [address] [place], [place] [address]  Acetaminophen level     Status: Abnormal   Collection Time: 04/07/20  1:13 AM  Result Value Ref Range   Acetaminophen (Tylenol), Serum <10 (L) 10 - 30 ug/mL    Comment: (NOTE) Therapeutic concentrations vary significantly. A range of 10-30 ug/mL  may be an effective concentration for many patients. However, some  are best treated at concentrations outside of this range. Acetaminophen concentrations >150 ug/mL at 4 hours after ingestion  and >50 ug/mL at 12 hours after ingestion are often associated with  toxic reactions.  Performed at [facility], [address], [place], [place] [address]   Ethanol     Status: Abnormal   Collection Time: 04/07/20  1:13 AM  Result Value Ref Range   Alcohol, Ethyl (B) 169 (H) <10 mg/dL    Comment: (NOTE) Lowest detectable limit for serum alcohol is 10 mg/dL.  For medical purposes only. Performed at [facility], [address], [place], [place] [address]   CBC WITH DIFFERENTIAL     Status: Abnormal    Collection Time: 04/07/20  1:13 AM  Result Value Ref Range   WBC 15.6 (H) 4.0 - 10.5 K/uL   RBC 4.60 3.87 - 5.11 MIL/uL   Hemoglobin 13.8 12.0 - 15.0 g/dL   HCT 35.7 01.7 - 79.3 %   MCV 89.3 80.0 - 100.0 fL   MCH 30.0 26.0 - 34.0 pg   MCHC 33.6 30.0 - 36.0 g/dL   RDW 90.3 00.9 - 23.3 %   Platelets 366 150 - 400 K/uL   nRBC 0.0 0.0 - 0.2 %   Neutrophils Relative % 75 %   Neutro Abs 11.6 (H) 1.7 - 7.7 K/uL   Lymphocytes Relative 18 %   Lymphs Abs 2.9 0.7 - 4.0 K/uL   Monocytes Relative 5 %   Monocytes Absolute 0.9 0.1 - 1.0 K/uL   Eosinophils Relative 1 %   Eosinophils Absolute 0.1 0.0 - 0.5 K/uL   Basophils Relative 0 %   Basophils Absolute 0.1 0.0 - 0.1 K/uL   Immature Granulocytes 1 %   Abs Immature Granulocytes 0.09 (H) 0.00 - 0.07 K/uL    Comment: Performed at [facility], [address], [place], [place] [address]  I-Stat beta hCG blood, ED     Status: None   Collection Time: 04/07/20  1:44 AM  Result Value Ref Range   I-stat hCG, quantitative <5.0 <5 mIU/mL   Comment 3            Comment:   GEST. AGE      CONC.  (mIU/mL)   <=1 WEEK        5 - 50     2 WEEKS       50 - 500     3 WEEKS       100 - 10,000     4 WEEKS     1,000 - 30,000        FEMALE AND NON-PREGNANT FEMALE:     LESS THAN 5 mIU/mL   Resp Panel by RT-PCR (Flu A&B, Covid) Nasopharyngeal Swab     Status: None   Collection Time: 04/07/20  7:12 AM   Specimen: Nasopharyngeal Swab; Nasopharyngeal(NP) swabs in vial transport medium  Result Value Ref Range   SARS Coronavirus 2 by RT PCR NEGATIVE NEGATIVE    Comment: (NOTE) SARS-CoV-2  target nucleic acids are NOT DETECTED.  The SARS-CoV-2 RNA is generally detectable in upper respiratory specimens during the acute phase of infection. The lowest concentration of SARS-CoV-2 viral copies this assay can detect is 138 copies/mL. A negative result does not preclude SARS-Cov-2 infection and should not be used as the sole basis for treatment  or other patient management decisions. A negative result may occur with  improper specimen collection/handling, submission of specimen other than nasopharyngeal swab, presence of viral mutation(s) within the areas targeted by this assay, and inadequate number of viral copies(<138 copies/mL). A negative result must be combined with clinical observations, patient history, and epidemiological information. The expected result is Negative.  Fact Sheet for Patients:  BloggerCourse.com  Fact Sheet for Healthcare Providers:  SeriousBroker.it  This test is no t yet approved or cleared by the [place] FDA and  has been authorized for detection and/or diagnosis of SARS-CoV-2 by FDA under an Emergency Use Authorization (EUA). This EUA will remain  in effect (meaning this test can be used) for the duration of the COVID-19 declaration under Section 564(b)(1) of the Act, 21 U.S.C.section 360bbb-3(b)(1), unless the authorization is terminated  or revoked sooner.       Influenza A by PCR NEGATIVE NEGATIVE   Influenza B by PCR NEGATIVE NEGATIVE    Comment: (NOTE) The Xpert Xpress SARS-CoV-2/FLU/RSV plus assay is intended as an aid in the diagnosis of influenza from Nasopharyngeal swab specimens and should not be used as a sole basis for treatment. Nasal washings and aspirates are unacceptable for Xpert Xpress SARS-CoV-2/FLU/RSV testing.  Fact Sheet for Patients: BloggerCourse.com  Fact Sheet for Healthcare Providers: SeriousBroker.it  This test is not yet approved or cleared by the [place] FDA and has been authorized for detection and/or diagnosis of SARS-CoV-2 by FDA under an Emergency Use Authorization (EUA). This EUA will remain in effect (meaning this test can be used) for the duration of the COVID-19 declaration under Section 564(b)(1) of the Act, 21 U.S.C. section  360bbb-3(b)(1), unless the authorization is terminated or revoked.  Performed at [facility], [address], [place], [place] [address]   Urine rapid drug screen (hosp performed)     Status: Abnormal   Collection Time: 04/07/20  7:58 AM  Result Value Ref Range   Opiates POSITIVE (A) NONE DETECTED   Cocaine NONE DETECTED NONE DETECTED   Benzodiazepines POSITIVE (A) NONE DETECTED   Amphetamines POSITIVE (A) NONE DETECTED   Tetrahydrocannabinol POSITIVE (A) NONE DETECTED   Barbiturates NONE DETECTED NONE DETECTED    Comment: (NOTE) DRUG SCREEN FOR MEDICAL PURPOSES ONLY.  IF CONFIRMATION IS NEEDED FOR ANY PURPOSE, NOTIFY LAB WITHIN 5 DAYS.  LOWEST DETECTABLE LIMITS FOR URINE DRUG SCREEN Drug Class                     Cutoff (ng/mL) Amphetamine and metabolites    1000 Barbiturate and metabolites    200 Benzodiazepine                 200 Tricyclics and metabolites     300 Opiates and metabolites        300 Cocaine and metabolites        300 THC                            50 Performed at [facility], [address], [place], [place] [address]     Blood  Alcohol level:  Lab Results  Component Value Date   ETH 169 (H) 04/07/2020   ETH 172 (H) 06/24/2015    Metabolic Disorder Labs:  Lab Results  Component Value Date   HGBA1C 5.0 06/25/2015   MPG 97 06/25/2015   Lab Results  Component Value Date   PROLACTIN 35.5 (H) 06/25/2015   Lab Results  Component Value Date   CHOL 189 06/25/2015   TRIG 207 (H) 06/25/2015   HDL 38 (L) 06/25/2015   CHOLHDL 5.0 06/25/2015   VLDL 41 (H) 06/25/2015   LDLCALC 110 (H) 06/25/2015    Current Medications: Current Facility-Administered Medications  Medication Dose Route Frequency Provider Last Rate Last Admin  . alum & mag hydroxide-simeth (MAALOX/MYLANTA) 200-200-20 MG/5ML suspension 30 mL  30 mL Oral Q4H PRN Antonieta Pert, MD      . chlordiazePOXIDE (LIBRIUM) capsule 25 mg  25 mg Oral  QID PRN Antonieta Pert, MD      . cloNIDine (CATAPRES) tablet 0.1 mg  0.1 mg Oral QID Antonieta Pert, MD       Followed by  . [START ON 04/09/2020] cloNIDine (CATAPRES) tablet 0.1 mg  0.1 mg Oral [facility] [place] Lasseter, Marlane Mingle, MD       Followed by  . [START ON 04/11/2020] cloNIDine (CATAPRES) tablet 0.1 mg  0.1 mg Oral QAC breakfast Antonieta Pert, MD      . dicyclomine (BENTYL) tablet 20 mg  20 mg Oral Q6H PRN Antonieta Pert, MD      . OLANZapine [facility]) injection 5 mg  5 mg Intramuscular BID PRN Antonieta Pert, MD       And  . diphenhydrAMINE (BENADRYL) injection 50 mg  50 mg Intramuscular BID PRN Antonieta Pert, MD      . folic acid (FOLVITE) tablet 1 mg  1 mg Oral Daily Antonieta Pert, MD      . hydrOXYzine (ATARAX/VISTARIL) tablet 25 mg  25 mg Oral Q6H PRN Antonieta Pert, MD      . loperamide (IMODIUM) capsule 2-4 mg  2-4 mg Oral PRN Antonieta Pert, MD      . magnesium hydroxide (MILK OF MAGNESIA) suspension 30 mL  30 mL Oral Daily PRN Antonieta Pert, MD      . methocarbamol (ROBAXIN) tablet 500 mg  500 mg Oral Q8H PRN Antonieta Pert, MD      . nicotine (NICODERM CQ - dosed in mg/24 hours) patch 21 mg  21 mg Transdermal Daily Antonieta Pert, MD      . OLANZapine zydis (ZYPREXA) disintegrating tablet 10 mg  10 mg Oral Q8H PRN Antonieta Pert, MD       And  . ziprasidone (GEODON) injection 20 mg  20 mg Intramuscular Q6H PRN Antonieta Pert, MD      . ondansetron (ZOFRAN-ODT) disintegrating tablet 4 mg  4 mg Oral Q6H PRN Antonieta Pert, MD      . thiamine tablet 100 mg  100 mg Oral Daily Antonieta Pert, MD       PTA Medications: Medications Prior to Admission  Medication Sig Dispense Refill Last Dose  . QUEtiapine (SEROQUEL) 300 MG tablet Take 1 tablet (300 mg total) by mouth at bedtime. 30 tablet 2   . QUEtiapine (SEROQUEL) 50 MG tablet Take 1 tablet (50 mg total) by mouth 2 (two) times daily. 60 tablet 1   . venlafaxine XR  (EFFEXOR XR) 37.5 MG 24 hr capsule Take  3 capsules daily 90 capsule 1   . nicotine (NICODERM CQ - DOSED IN MG/24 HOURS) 21 mg/24hr patch Place 1 patch (21 mg total) onto the skin daily. (Patient not taking: Reported on 04/07/2020) 28 patch 0 Not Taking at Unknown time    Musculoskeletal: Strength & Muscle Tone: within normal limits Gait & Station: normal Patient leans: N/A            Psychiatric Specialty Exam:  Presentation  General Appearance: Disheveled  Eye Contact:Poor  Speech:Normal Rate  Speech Volume:Decreased  Handedness:Right   Mood and Affect  Mood:Dysphoric  Affect:Congruent   Thought Process  Thought Processes:Goal Directed  Duration of Psychotic Symptoms: No data recorded Past Diagnosis of Schizophrenia or Psychoactive disorder: No  Descriptions of Associations:Circumstantial  Orientation:Full (Time, Place and Person)  Thought Content:Logical  Hallucinations:Hallucinations: None  Ideas of Reference:None  Suicidal Thoughts:Suicidal Thoughts: Yes, Passive SI Passive Intent and/or Plan: Without Intent  Homicidal Thoughts:Homicidal Thoughts: No   Sensorium  Memory:Immediate Poor; Recent Poor; Remote Poor  Judgment:Impaired  Insight:Lacking   Executive Functions  Concentration:No data recorded Attention Span:Fair  Recall:Poor  Fund of Knowledge:Fair  Language:Fair   Psychomotor Activity  Psychomotor Activity:Psychomotor Activity: Decreased   Assets  Assets:Desire for Improvement; Resilience   Sleep  Sleep:Sleep: Poor    Physical Exam: Physical Exam Vitals and nursing note reviewed.  HENT:     Head: Normocephalic and atraumatic.  Pulmonary:     Effort: Pulmonary effort is normal.  Neurological:     General: No focal deficit present.     Mental Status: She is alert and oriented to person, place, and time.    ROS Last menstrual period 01/08/2013. There is no height or weight on file to calculate  BMI.  Treatment Plan Summary: Daily contact with patient to assess and evaluate symptoms and progress in treatment, Medication management and Plan : Patient is seen and examined.  Patient is a 45 year old female with the above-stated past psychiatric history who was admitted secondary to erratic behavior most likely secondary to her substance use disorders.  She will be admitted to the hospital.  She will be integrated in the milieu.  She will be encouraged to attend groups.  Because of the presence of the benzodiazepines as well as the opiates, she will be placed on Librium 25 mg p.o. every 6 hours as needed a CIWA greater than 10.  She reports an allergy to lorazepam.  This will also be beneficial with regard to her blood alcohol being elevated and possible alcohol withdrawal syndromes.  Also because the presence of the opiate she will be placed on the opiate detox protocol.  We will monitor for withdrawal symptoms from those as well.  We will restart her venlafaxine extended release at 37.5 mg 3 capsules p.o. daily.  She will also continue on the Seroquel 50 mg p.o. twice daily and 300 mg p.o. nightly.  Review of her admission laboratories revealed a low sodium at 3.0.  That will be supplemented.  Her creatinine was normal at 1.09.  Liver function enzymes were normal.  CBC was normal.  Differential was essentially normal as well.  Acetaminophen was less than 10, salicylate less than 7.  Beta-hCG was negative.  Respiratory panel for influenza A, B and coronavirus were all negative.  Her blood alcohol on admission was 169.  Drug screen was positive for amphetamines, benzodiazepines, opiates and marijuana.  Her EKG looked like biatrial enlargement with a normal QTc interval and a sinus rhythm.  Her  vital signs are stable, she is afebrile.  Her pulse oximetry on room air was 98%.  Observation Level/Precautions:  Detox 15 minute checks  Laboratory:  Chemistry Profile  Psychotherapy:    Medications:     Consultations:    Discharge Concerns:    Estimated LOS:  Other:     Physician Treatment Plan for Primary Diagnosis: <principal problem not specified> Long Term Goal(s): Improvement in symptoms so as ready for discharge  Short Term Goals: Ability to identify changes in lifestyle to reduce recurrence of condition will improve, Ability to verbalize feelings will improve, Ability to disclose and discuss suicidal ideas, Ability to demonstrate self-control will improve, Ability to identify and develop effective coping behaviors will improve, Ability to maintain clinical measurements within normal limits will improve, Compliance with prescribed medications will improve and Ability to identify triggers associated with substance abuse/mental health issues will improve  Physician Treatment Plan for Secondary Diagnosis: Active Problems:   Bipolar affective disorder, depressed, severe (HCC)  Long Term Goal(s): Improvement in symptoms so as ready for discharge  Short Term Goals: Ability to identify changes in lifestyle to reduce recurrence of condition will improve, Ability to verbalize feelings will improve, Ability to disclose and discuss suicidal ideas, Ability to demonstrate self-control will improve, Ability to identify and develop effective coping behaviors will improve, Ability to maintain clinical measurements within normal limits will improve, Compliance with prescribed medications will improve and Ability to identify triggers associated with substance abuse/mental health issues will improve  I certify that inpatient services furnished can reasonably be expected to improve the patient's condition.    Antonieta PertGreg Lawson Leighana Neyman, MD 4/7/20223:33 PM

## 2020-04-07 NOTE — Progress Notes (Signed)
Pt under review at [facility]. 
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
## 2020-04-07 NOTE — ED Provider Notes (Signed)
Emergency Medicine Observation Re-evaluation Note  Kaitlyn Shepherd is a 45 y.o. female, seen on rounds today.  Pt initially presented to the ED for complaints of Suicidal and Drug Overdose Currently, the patient is alert, content.   Physical Exam  BP 104/83   Pulse 68   Temp 98.3 F (36.8 C) (Oral)   Resp 15   Ht 1.626 m (5\' 4" )   Wt 59.4 kg   LMP 01/08/2013 Comment: pt denies pregnancy and states tubal ligation 02-28-13  SpO2 98%   BMI 22.48 kg/m  Physical Exam General: awake, alert.  Cardiac: regular rate Lungs: breathing comfortably Psych: content, calm.   ED Course / MDM  EKG:EKG Interpretation  Date/Time:  Thursday April 07 2020 01:11:22 EDT Ventricular Rate:  86 PR Interval:  190 QRS Duration: 104 QT Interval:  375 QTC Calculation: 449 R Axis:   100 Text Interpretation: Sinus rhythm RAE, consider biatrial enlargement Right axis deviation Low voltage, precordial leads Confirmed by [id number] (Marianna Fuss) on 04/07/2020 2:14:15 AM   I have reviewed the labs performed to date as well as medications administered while in observation.  Recent changes in the last 24 hours include BH assessment, stabilization.   Plan  Current plan is that patient has been accepted for admission to [facility].    Pt appears stable for movement to [facility].    [facility], MD 04/07/20 1108

## 2020-04-07 NOTE — [facility] Counselor (Signed)
TTS complete. Dr. Bronwen Betters recommends in patient treatment. [facility] reviewing.
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
## 2020-04-07 NOTE — BHH Suicide Risk Assessment (Signed)
[facility] Admission Suicide Risk Assessment   Nursing information obtained from:    Demographic factors:    Current Mental Status:    Loss Factors:    Historical Factors:    Risk Reduction Factors:     Total Time spent with patient: 30 minutes Principal Problem: <principal problem not specified> Diagnosis:  Active Problems:   Bipolar affective disorder, depressed, severe (HCC)  Subjective Data: Patient is seen and examined.  Patient is a 45 year old female with a past psychiatric history significant for bipolar disorder as well as polysubstance use disorders who presented to the [facility] emergency department on 04/07/2020 via EMS from home.  The patient was apparently very erratic, and the patient stated that she was upset over this being the date of the anniversary of her son's suicide.  She had apparently taken an unknown amount of Xanax, alcohol, methamphetamines.  EMS gave the patient 5 mg of midazolam as well as 5 mg Haldol secondary to her agitation.  The patient had been placed in involuntary commitment by the emergency room physician after the patient had attempted to elope from the emergency department.  The evaluation by the comprehensive clinical assessment team was vague secondary to the patient's sedation.  She was noted to be drowsy but cooperative.  She stated that yesterday was the fifth anniversary of the death of her son by suicide.  She admitted to drinking too much alcohol last night.  Although her drug screen had the presence of benzodiazepines, opiates, amphetamines and marijuana she denied having used any methamphetamines or opiates.  Her blood alcohol on admission was 169.  She was transferred to our facility for evaluation and stabilization.  Her examination today still remains rather limited.  She is laying in the bed, and was sleeping comfortably.  She woke up enough to tell me about the anniversary of the death of her son.  She did also state that she was  sweating.  She is followed in the outpatient clinic at the behavioral health urgent care center by Dr. Evelene Croon.  The patient stated today that she had been compliant with her medication she was receiving from Dr. Evelene Croon.  Her last video visit was on 12/02/2019.  Her medications at that time included venlafaxine extended release 37.5 mg 3 capsules a day, Seroquel 300 mg p.o. nightly and 50 mg p.o. twice daily.  Review of the PMP database revealed no recent prescriptions for any controlled substances.  She was admitted to the hospital for evaluation and stabilization.  Continued Clinical Symptoms:    The "Alcohol Use Disorders Identification Test", Guidelines for Use in Primary Care, Second Edition.  World Science writer [facility]). Score between 0-7:  no or low risk or alcohol related problems. Score between 8-15:  moderate risk of alcohol related problems. Score between 16-19:  high risk of alcohol related problems. Score 20 or above:  warrants further diagnostic evaluation for alcohol dependence and treatment.   CLINICAL FACTORS:   Bipolar Disorder:   Depressive phase Alcohol/Substance Abuse/Dependencies   Musculoskeletal: Strength & Muscle Tone: within normal limits Gait & Station: normal Patient leans: N/A  Psychiatric Specialty Exam:  Presentation  General Appearance: Disheveled  Eye Contact:Poor  Speech:Normal Rate  Speech Volume:Decreased  Handedness:Right   Mood and Affect  Mood:Dysphoric  Affect:Congruent   Thought Process  Thought Processes:Goal Directed  Descriptions of Associations:Circumstantial  Orientation:Full (Time, Place and Person)  Thought Content:Logical  History of Schizophrenia/Schizoaffective disorder:No  Duration of Psychotic Symptoms:No data recorded Hallucinations:Hallucinations: None  Ideas of Reference:None  Suicidal Thoughts:Suicidal Thoughts: Yes, Passive SI Passive Intent and/or Plan: Without Intent  Homicidal Thoughts:Homicidal  Thoughts: No   Sensorium  Memory:Immediate Poor; Recent Poor; Remote Poor  Judgment:Impaired  Insight:Lacking   Executive Functions  Concentration:No data recorded Attention Span:Fair  Recall:Poor  Fund of Knowledge:Fair  Language:Fair   Psychomotor Activity  Psychomotor Activity:Psychomotor Activity: Decreased   Assets  Assets:Desire for Improvement; Resilience   Sleep  Sleep:Sleep: Poor    Physical Exam: Physical Exam Vitals and nursing note reviewed.  HENT:     Head: Normocephalic and atraumatic.  Pulmonary:     Effort: Pulmonary effort is normal.  Neurological:     General: No focal deficit present.     Mental Status: She is alert and oriented to person, place, and time.    ROS Last menstrual period 01/08/2013. There is no height or weight on file to calculate BMI.   COGNITIVE FEATURES THAT CONTRIBUTE TO RISK:  None    SUICIDE RISK:   Moderate:  Frequent suicidal ideation with limited intensity, and duration, some specificity in terms of plans, no associated intent, good self-control, limited dysphoria/symptomatology, some risk factors present, and identifiable protective factors, including available and accessible social support.  PLAN OF CARE: Patient is seen and examined.  Patient is a 45 year old female with the above-stated past psychiatric history who was admitted secondary to erratic behavior most likely secondary to her substance use disorders.  She will be admitted to the hospital.  She will be integrated in the milieu.  She will be encouraged to attend groups.  Because of the presence of the benzodiazepines as well as the opiates, she will be placed on Librium 25 mg p.o. every 6 hours as needed a CIWA greater than 10.  She reports an allergy to lorazepam.  This will also be beneficial with regard to her blood alcohol being elevated and possible alcohol withdrawal syndromes.  Also because the presence of the opiate she will be placed on the opiate  detox protocol.  We will monitor for withdrawal symptoms from those as well.  We will restart her venlafaxine extended release at 37.5 mg 3 capsules p.o. daily.  She will also continue on the Seroquel 50 mg p.o. twice daily and 300 mg p.o. nightly.  Review of her admission laboratories revealed a low sodium at 3.0.  That will be supplemented.  Her creatinine was normal at 1.09.  Liver function enzymes were normal.  CBC was normal.  Differential was essentially normal as well.  Acetaminophen was less than 10, salicylate less than 7.  Beta-hCG was negative.  Respiratory panel for influenza A, B and coronavirus were all negative.  Her blood alcohol on admission was 169.  Drug screen was positive for amphetamines, benzodiazepines, opiates and marijuana.  Her EKG looked like biatrial enlargement with a normal QTc interval and a sinus rhythm.  Her vital signs are stable, she is afebrile.  Her pulse oximetry on room air was 98%.  I certify that inpatient services furnished can reasonably be expected to improve the patient's condition.   Antonieta Pert, MD 04/07/2020, 3:04 PM

## 2020-04-07 NOTE — ED Notes (Signed)
Transport called.

## 2020-04-07 NOTE — ED Provider Notes (Addendum)
[facility]-EMERGENCY DEPT Provider Note   CSN: [id number] Arrival date & time: 04/07/20  0035     History Chief Complaint  Patient presents with  . Suicidal  . Drug Overdose    Kaitlyn Shepherd is a 45 y.o. female.  Presented to ER with concern for possible overdose, suicidal ideation.  Patient reports taking unknown quantity of Xanax, alcohol, meth per police.  Patient was agitated, erratic, EMS provided Versed and Haldol.  Patient later states that she did not actually intend on committing suicide.  No auditory visual hallucinations.  Denies ongoing thoughts of suicide.  History of anxiety, bipolar, panic attacks, polysubstance abuse  HPI     Past Medical History:  Diagnosis Date  . Anxiety   . Bipolar 1 disorder (HCC)   . Migraine   . Panic attacks   . Seizures [facility])     Patient Active Problem List   Diagnosis Date Noted  . Bipolar I disorder (HCC) 12/02/2019  . Anxiety 12/02/2019  . PTSD (post-traumatic stress disorder) 12/02/2019  . Cannabis use disorder, mild, abuse 06/27/2015  . Bipolar disorder, curr episode mixed, severe, w/o psychotic features (HCC) 06/27/2015  . Alcohol abuse with alcohol-induced mood disorder (HCC)   . Alcohol use disorder, severe, dependence (HCC) 06/24/2015  . Cocaine abuse (HCC) 12/19/2014  . Alcohol abuse 12/19/2014  . 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 Alcohol intoxication (HCC)   . Suicidal ideation   . Bipolar affective disorder, currently depressed, moderate (HCC)   . Bipolar affective disorder, depressed (HCC) 09/24/2014  . Overdose 09/24/2014    Past Surgical History:  Procedure Laterality Date  . TUBAL LIGATION       OB History   No obstetric history on file.     History reviewed. No pertinent family history.  Social History   Tobacco Use  . Smoking status: Current Every Day Smoker  . Smokeless tobacco: Never Used  Vaping Use  . Vaping Use: Never used  Substance Use Topics   . Alcohol use: Yes    Comment: heavy  . Drug use: Yes    Types: Marijuana, Methamphetamines    Home Medications Prior to Admission medications   Medication Sig Start Date End Date Taking? Authorizing Provider  nicotine (NICODERM CQ - DOSED IN MG/24 HOURS) 21 mg/24hr patch Place 1 patch (21 mg total) onto the skin daily. 06/27/15   Oneta Rack, NP  QUEtiapine (SEROQUEL) 300 MG tablet Take 1 tablet (300 mg total) by mouth at bedtime. 03/31/20   Zena Amos, MD  QUEtiapine (SEROQUEL) 50 MG tablet Take 1 tablet (50 mg total) by mouth 2 (two) times daily. 03/31/20   Zena Amos, MD  venlafaxine XR (EFFEXOR XR) 37.5 MG 24 hr capsule Take 3 capsules daily 12/02/19   Zena Amos, MD    Allergies    Ativan [lorazepam], Buspar [buspirone], Darvocet [propoxyphene n-acetaminophen], Lithium, and Toradol [ketorolac tromethamine]  Review of Systems   Review of Systems  Constitutional: Negative for chills and fever.  HENT: Negative for ear pain and sore throat.   Eyes: Negative for pain and visual disturbance.  Respiratory: Negative for cough and shortness of breath.   Cardiovascular: Negative for chest pain and palpitations.  Gastrointestinal: Negative for abdominal pain and vomiting.  Genitourinary: Negative for dysuria and hematuria.  Musculoskeletal: Negative for arthralgias and back pain.  Skin: Negative for color change and rash.  Neurological: Negative for seizures and syncope.  All other systems reviewed and are negative.   Physical  Exam Updated Vital Signs BP 104/83   Pulse 68   Temp 98.3 F (36.8 C) (Oral)   Resp 15   Ht 5\' 4"  (1.626 m)   Wt 59.4 kg   LMP 01/08/2013 Comment: pt denies pregnancy and states tubal ligation 02-28-13  SpO2 98%   BMI 22.48 kg/m   Physical Exam Vitals and nursing note reviewed.  Constitutional:      General: She is not in acute distress.    Appearance: She is well-developed.     Comments: Intermittently agitated, intoxicated  HENT:      Head: Normocephalic and atraumatic.  Eyes:     Conjunctiva/sclera: Conjunctivae normal.  Cardiovascular:     Rate and Rhythm: Normal rate and regular rhythm.     Heart sounds: No murmur heard.   Pulmonary:     Effort: Pulmonary effort is normal. No respiratory distress.     Breath sounds: Normal breath sounds.  Abdominal:     Palpations: Abdomen is soft.     Tenderness: There is no abdominal tenderness.  Musculoskeletal:     Cervical back: Neck supple.  Skin:    General: Skin is warm and dry.  Neurological:     Mental Status: She is alert.     Comments: Alert, answers basic questions appropriately  Psychiatric:     Comments: Denies SI or HI     ED Results / Procedures / Treatments   Labs (all labs ordered are listed, but only abnormal results are displayed) Labs Reviewed  COMPREHENSIVE METABOLIC PANEL - Abnormal; Notable for the following components:      Result Value   Potassium 3.0 (*)    CO2 20 (*)    Creatinine, Ser 1.09 (*)    Total Protein 8.4 (*)    All other components within normal limits  SALICYLATE LEVEL - Abnormal; Notable for the following components:   Salicylate Lvl <7.0 (*)    All other components within normal limits  ACETAMINOPHEN LEVEL - Abnormal; Notable for the following components:   Acetaminophen (Tylenol), Serum <10 (*)    All other components within normal limits  ETHANOL - Abnormal; Notable for the following components:   Alcohol, Ethyl (B) 169 (*)    All other components within normal limits  CBC WITH DIFFERENTIAL/PLATELET - Abnormal; Notable for the following components:   WBC 15.6 (*)    Neutro Abs 11.6 (*)    Abs Immature Granulocytes 0.09 (*)    All other components within normal limits  RESP PANEL BY RT-PCR (FLU A&B, COVID) ARPGX2  RAPID URINE DRUG SCREEN, HOSP PERFORMED  I-STAT BETA HCG BLOOD, ED ([facility], [facility], [facility] ONLY)    EKG EKG Interpretation  Date/Time:  Thursday April 07 2020 01:11:22 EDT Ventricular Rate:  86 PR  Interval:  190 QRS Duration: 104 QT Interval:  375 QTC Calculation: 449 R Axis:   100 Text Interpretation: Sinus rhythm RAE, consider biatrial enlargement Right axis deviation Low voltage, precordial leads Confirmed by [birth year]1970 (Marianna Fuss) on 04/07/2020 2:14:15 AM   Radiology No results found.  Procedures Procedures   Medications Ordered in ED Medications  OLANZapine zydis (ZYPREXA) disintegrating tablet 10 mg (10 mg Oral Given 04/07/20 0709)    And  LORazepam (ATIVAN) tablet 1 mg (has no administration in time range)    And  ziprasidone (GEODON) injection 20 mg (has no administration in time range)  LORazepam (ATIVAN) injection 1 mg (1 mg Intravenous Given 04/07/20 0139)    ED Course  I have reviewed the  triage vital signs and the nursing notes.  Pertinent labs & imaging results that were available during my care of the patient were reviewed by me and considered in my medical decision making (see chart for details).    MDM Rules/Calculators/A&P                         45 year old lady presents to ER after concern for suicidality, meth/alcohol/benzodiazepine intoxication.  On arrival here, patient was somewhat agitated, confused.  Provided benzodiazepines, observed.  Basic labs stable.  She is medically cleared for psychiatric evaluation.  At present she states that she is willing to voluntarily stay for psych to evaluate.    We will place in psych hold while awaiting psych eval.  Notified by nursing staff that patient is attempting to leave.  No longer voluntary.  Will complete IVC paperwork.   Final Clinical Impression(s) / ED Diagnoses Final diagnoses:  Polysubstance abuse (HCC)  Suicidal ideation    Rx / DC Orders ED Discharge Orders    None       Milagros Loll, MD 04/07/20 [phone]

## 2020-04-07 NOTE — ED Notes (Signed)
Pt calm and cooperative at this time. Geodon held.

## 2020-04-07 NOTE — Plan of Care (Signed)
Nurse discussed coping skills with patient.  

## 2020-04-07 NOTE — ED Notes (Signed)
Pt accepted to [facility] room 306-1
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
## 2020-04-08 MED ORDER — QUETIAPINE FUMARATE 50 MG PO TABS
50.0000 mg | ORAL_TABLET | Freq: Two times a day (BID) | ORAL | Status: DC | PRN
  Administered 2020-04-08: 50 mg via ORAL
  Filled 2020-04-08: qty 10
  Filled 2020-04-08: qty 1

## 2020-04-08 MED ORDER — QUETIAPINE FUMARATE 200 MG PO TABS
200.0000 mg | ORAL_TABLET | Freq: Every day | ORAL | Status: DC
  Administered 2020-04-08: 200 mg via ORAL
  Filled 2020-04-08 (×3): qty 1

## 2020-04-08 NOTE — Progress Notes (Signed)
Patient did not attend the evening speaker AA meeting.  

## 2020-04-08 NOTE — Progress Notes (Signed)
   04/08/20 0625  Psych Admission Type (Psych Patients Only)  Admission Status Involuntary  Psychosocial Assessment  Patient Complaints None  Eye Contact Brief  Facial Expression Flat  Affect Appropriate to circumstance  Speech Logical/coherent  Interaction Minimal  Motor Activity Slow  Appearance/Hygiene Disheveled;Poor hygiene  Behavior Characteristics Cooperative  Mood Depressed  Thought Process  Coherency WDL  Content WDL  Delusions None reported or observed  Perception WDL  Hallucination None reported or observed  Judgment Impaired  Confusion UTA  Danger to Self  Current suicidal ideation? Denies  Danger to Others  Danger to Others None reported or observed   Pt slept the entire shift. Got up this morning for vitals. Pt says she feels good. Denies SI, HI, AVH and pain.

## 2020-04-08 NOTE — Tx Team (Signed)
Interdisciplinary Treatment and Diagnostic Plan Update  04/08/2020 Time of Session: 9:20am Kaitlyn Shepherd MRN: [id number]  Principal Diagnosis: <principal problem not specified>  Secondary Diagnoses: Active Problems:   Bipolar affective disorder, depressed, severe (HCC)   Current Medications:  Current Facility-Administered Medications  Medication Dose Route Frequency Provider Last Rate Last Admin  . alum & mag hydroxide-simeth (MAALOX/MYLANTA) 200-200-20 MG/5ML suspension 30 mL  30 mL Oral Q4H PRN Sharma Covert, MD      . chlordiazePOXIDE (LIBRIUM) capsule 25 mg  25 mg Oral QID PRN Sharma Covert, MD      . cloNIDine (CATAPRES) tablet 0.1 mg  0.1 mg Oral QID Sharma Covert, MD   0.1 mg at 04/08/20 1300   Followed by  . [START ON 04/09/2020] cloNIDine (CATAPRES) tablet 0.1 mg  0.1 mg Oral BH-qamhs Clary, Cordie Grice, MD       Followed by  . [START ON 04/11/2020] cloNIDine (CATAPRES) tablet 0.1 mg  0.1 mg Oral QAC breakfast Sharma Covert, MD      . dicyclomine (BENTYL) tablet 20 mg  20 mg Oral Q6H PRN Sharma Covert, MD      . OLANZapine [facility]) injection 5 mg  5 mg Intramuscular BID PRN Sharma Covert, MD       And  . diphenhydrAMINE (BENADRYL) injection 50 mg  50 mg Intramuscular BID PRN Sharma Covert, MD      . folic acid (FOLVITE) tablet 1 mg  1 mg Oral Daily Sharma Covert, MD   1 mg at 04/08/20 9449  . hydrOXYzine (ATARAX/VISTARIL) tablet 25 mg  25 mg Oral Q6H PRN Sharma Covert, MD      . loperamide (IMODIUM) capsule 2-4 mg  2-4 mg Oral PRN Sharma Covert, MD      . magnesium hydroxide (MILK OF MAGNESIA) suspension 30 mL  30 mL Oral Daily PRN Sharma Covert, MD      . methocarbamol (ROBAXIN) tablet 500 mg  500 mg Oral Q8H PRN Sharma Covert, MD      . nicotine (NICODERM CQ - dosed in mg/24 hours) patch 21 mg  21 mg Transdermal Daily Sharma Covert, MD   21 mg at 04/08/20 0925  . OLANZapine zydis (ZYPREXA) disintegrating  tablet 10 mg  10 mg Oral Q8H PRN Sharma Covert, MD       And  . ziprasidone (GEODON) injection 20 mg  20 mg Intramuscular Q6H PRN Sharma Covert, MD      . ondansetron (ZOFRAN-ODT) disintegrating tablet 4 mg  4 mg Oral Q6H PRN Sharma Covert, MD      . QUEtiapine (SEROQUEL) tablet 200 mg  200 mg Oral QHS Sharma Covert, MD      . QUEtiapine (SEROQUEL) tablet 50 mg  50 mg Oral BID PRN Sharma Covert, MD      . thiamine tablet 100 mg  100 mg Oral Daily Sharma Covert, MD   100 mg at 04/08/20 0926  . venlafaxine XR (EFFEXOR-XR) 24 hr capsule 75 mg  75 mg Oral Q breakfast Sharma Covert, MD   75 mg at 04/08/20 6759   PTA Medications: Medications Prior to Admission  Medication Sig Dispense Refill Last Dose  . QUEtiapine (SEROQUEL) 300 MG tablet Take 1 tablet (300 mg total) by mouth at bedtime. 30 tablet 2   . QUEtiapine (SEROQUEL) 50 MG tablet Take 1 tablet (50 mg total) by mouth 2 (two) times daily. [place]  tablet 1   . venlafaxine XR (EFFEXOR XR) 37.5 MG 24 hr capsule Take 3 capsules daily 90 capsule 1   . nicotine (NICODERM CQ - DOSED IN MG/24 HOURS) 21 mg/24hr patch Place 1 patch (21 mg total) onto the skin daily. (Patient not taking: Reported on 04/07/2020) 28 patch 0 Not Taking at Unknown time    Patient Stressors:    Patient Strengths:    Treatment Modalities: Medication Management, Group therapy, Case management,  1 to 1 session with clinician, Psychoeducation, Recreational therapy.   Physician Treatment Plan for Primary Diagnosis: <principal problem not specified> Long Term Goal(s): Improvement in symptoms so as ready for discharge Improvement in symptoms so as ready for discharge   Short Term Goals: Ability to identify changes in lifestyle to reduce recurrence of condition will improve Ability to verbalize feelings will improve Ability to disclose and discuss suicidal ideas Ability to demonstrate self-control will improve Ability to identify and develop  effective coping behaviors will improve Ability to maintain clinical measurements within normal limits will improve Compliance with prescribed medications will improve Ability to identify triggers associated with substance abuse/mental health issues will improve Ability to identify changes in lifestyle to reduce recurrence of condition will improve Ability to verbalize feelings will improve Ability to disclose and discuss suicidal ideas Ability to demonstrate self-control will improve Ability to identify and develop effective coping behaviors will improve Ability to maintain clinical measurements within normal limits will improve Compliance with prescribed medications will improve Ability to identify triggers associated with substance abuse/mental health issues will improve  Medication Management: Evaluate patient's response, side effects, and tolerance of medication regimen.  Therapeutic Interventions: 1 to 1 sessions, Unit Group sessions and Medication administration.  Evaluation of Outcomes: Not Met  Physician Treatment Plan for Secondary Diagnosis: Active Problems:   Bipolar affective disorder, depressed, severe ([place])  Long Term Goal(s): Improvement in symptoms so as ready for discharge Improvement in symptoms so as ready for discharge   Short Term Goals: Ability to identify changes in lifestyle to reduce recurrence of condition will improve Ability to verbalize feelings will improve Ability to disclose and discuss suicidal ideas Ability to demonstrate self-control will improve Ability to identify and develop effective coping behaviors will improve Ability to maintain clinical measurements within normal limits will improve Compliance with prescribed medications will improve Ability to identify triggers associated with substance abuse/mental health issues will improve Ability to identify changes in lifestyle to reduce recurrence of condition will improve Ability to verbalize  feelings will improve Ability to disclose and discuss suicidal ideas Ability to demonstrate self-control will improve Ability to identify and develop effective coping behaviors will improve Ability to maintain clinical measurements within normal limits will improve Compliance with prescribed medications will improve Ability to identify triggers associated with substance abuse/mental health issues will improve     Medication Management: Evaluate patient's response, side effects, and tolerance of medication regimen.  Therapeutic Interventions: 1 to 1 sessions, Unit Group sessions and Medication administration.  Evaluation of Outcomes: Not Met   RN Treatment Plan for Primary Diagnosis: <principal problem not specified> Long Term Goal(s): Knowledge of disease and therapeutic regimen to maintain health will improve  Short Term Goals: Ability to participate in decision making will improve, Ability to verbalize feelings will improve and Ability to disclose and discuss suicidal ideas  Medication Management: RN will administer medications as ordered by provider, will assess and evaluate patient's response and provide education to patient for prescribed medication. RN will report any adverse and/or  side effects to prescribing provider.  Therapeutic Interventions: 1 on 1 counseling sessions, Psychoeducation, Medication administration, Evaluate responses to treatment, Monitor vital signs and CBGs as ordered, Perform/monitor CIWA, COWS, AIMS and Fall Risk screenings as ordered, Perform wound care treatments as ordered.  Evaluation of Outcomes: Not Met   LCSW Treatment Plan for Primary Diagnosis: <principal problem not specified> Long Term Goal(s): Safe transition to appropriate next level of care at discharge, Engage patient in therapeutic group addressing interpersonal concerns.  Short Term Goals: Engage patient in aftercare planning with referrals and resources, Increase social support and  Increase emotional regulation  Therapeutic Interventions: Assess for all discharge needs, 1 to 1 time with Social worker, Explore available resources and support systems, Assess for adequacy in community support network, Educate family and significant other(s) on suicide prevention, Complete Psychosocial Assessment, Interpersonal group therapy.  Evaluation of Outcomes: Not Met   Progress in Treatment: Attending groups: No. Participating in groups: No. Taking medication as prescribed: Yes. Toleration medication: Yes. Family/Significant other contact made: No, will contact:  spouse Patient understands diagnosis: Yes. Discussing patient identified problems/goals with staff: Yes. Medical problems stabilized or resolved: Yes. Denies suicidal/homicidal ideation: Yes. Issues/concerns per patient self-inventory: No. Other: None  New problem(s) identified: No, Describe:  None  New Short Term/Long Term Goal(s):medication stabilization, elimination of SI thoughts, development of comprehensive mental wellness plan.  Patient Goals:  Pt was sleeping and declined to wake and participate.  Discharge Plan or Barriers: Patient recently admitted. CSW will continue to follow and assess for appropriate referrals and possible discharge planning.  Reason for Continuation of Hospitalization: Medication stabilization Suicidal ideation Withdrawal symptoms  Estimated Length of Stay: 3-5 days  Attendees: Patient: 04/08/2020   Physician: Dr. Myles Lipps 04/08/2020   Nursing:  04/08/2020   RN Care Manager: 04/08/2020   Social Worker: Toney Reil, [place]  04/08/2020   Recreational Therapist:  04/08/2020   Other:  04/08/2020   Other:  04/08/2020   Other: 04/08/2020      Scribe for Treatment Team: Mliss Fritz, [place] 04/08/2020 2:25 PM

## 2020-04-08 NOTE — Progress Notes (Signed)
Recreation Therapy Notes  Date: 4.8.22 Time: 0930 Location: [address] Dayroom  Group Topic: Stress Management  Goal Area(s) Addresses:  Patient will identify positive stress management techniques. Patient will identify benefits of using stress management post d/c.  Intervention: Stress Management  Activity: Meditation.  LRT played a meditation that focused on making the most of your day and having a positive outlook on what your day can be when you have positive intentions.    Education:  Stress Management, Discharge Planning.   Education Outcome: Acknowledges Education  Clinical Observations/Feedback: Pt did not attend group session.   Caroll Rancher, LRT/CTRS         Lillia Abed, [place] Dolinger A 04/08/2020 10:45 AM
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
## 2020-04-08 NOTE — Progress Notes (Signed)
Pt denies SI/HI/AVH.  Denies withdrawal symptoms.  Stays in her room for most of the day.  RN established rapport with pt and assessed for needs and concerns. RN administered medications per MD orders. Pt is coping well at this time.  Pt resting in bed.  Pt remains safe with q 15 min checks in place.

## 2020-04-08 NOTE — Progress Notes (Signed)
[facility] MD Progress Note  04/08/2020 11:36 AM Ar[place] Freshour  MRN:  [id number] Subjective: Patient is a 45 year old female with a past psychiatric history significant for bipolar disorder as well as polysubstance use disorders who presented to the [facility] emergency department on 04/07/2020 via EMS.  She was erratic, and her drug screen ended up showing benzodiazepines, alcohol, opiates, methamphetamines and marijuana.  Objective: Patient is seen and examined.  Patient is a 45 year old female with the above-stated past psychiatric history who is seen in follow-up.  She looks better today.  She rated yesterday as a 0 out of 10, and stated she felt about an 8 out of 10 today.  She denied any current withdrawal symptoms.  She denied any suicidal or homicidal ideation.  No psychotic symptoms.  She stated "yesterday I was drunk".  Her blood pressure is mildly elevated this morning at 127/86 and repeat was 136/104.  Her pulse was initially 84 and repeat was 103.  She is afebrile.  Her pulse oximetry on room air was 100%.  No tremulousness, good eye contact.  She is requesting to be discharged today, and I told her that I wanted to watch her for at least another 24 hours to make sure about any possible withdrawal syndromes.  Review of her admission laboratories from yesterday showed a low potassium at 3.0, but that was supplemented.  Creatinine was normal liver function enzymes were normal.  Her white blood cell count was mildly elevated at 15.6.  The rest of her CBC was normal.  Differential showed an absolute neutrophil count that was mildly elevated at 11.6.  Acetaminophen was less than 10, salicylate less than 7.  Beta-hCG was negative.  Respiratory panel for influenza A, B and coronavirus were all negative.  Blood alcohol on admission was 169.  Drug screen as per above.  Principal Problem: <principal problem not specified> Diagnosis: Active Problems:   Bipolar affective disorder, depressed,  severe (HCC)  Total Time spent with patient: 20 minutes  Past Psychiatric History: See admission H&P  Past Medical History:  Past Medical History:  Diagnosis Date  . Anxiety   . Bipolar 1 disorder (HCC)   . Migraine   . Panic attacks   . Seizures (HCC)     Past Surgical History:  Procedure Laterality Date  . TUBAL LIGATION     Family History: History reviewed. No pertinent family history. Family Psychiatric  History: See admission H&P Social History:  Social History   Substance and Sexual Activity  Alcohol Use Yes   Comment: heavy     Social History   Substance and Sexual Activity  Drug Use Yes  . Types: Marijuana, Methamphetamines    Social History   Socioeconomic History  . Marital status: Divorced    Spouse name: Not on file  . Number of children: Not on file  . Years of education: Not on file  . Highest education level: Not on file  Occupational History  . Not on file  Tobacco Use  . Smoking status: Current Every Day Smoker  . Smokeless tobacco: Never Used  Vaping Use  . Vaping Use: Never used  Substance and Sexual Activity  . Alcohol use: Yes    Comment: heavy  . Drug use: Yes    Types: Marijuana, Methamphetamines  . Sexual activity: Yes  Other Topics Concern  . Not on file  Social History Narrative  . Not on file   Social Determinants of Health   Financial Resource Strain: Not  on file  Food Insecurity: Not on file  Transportation Needs: Not on file  Physical Activity: Not on file  Stress: Not on file  Social Connections: Not on file   Additional Social History:                         Sleep: Good  Appetite:  Fair  Current Medications: Current Facility-Administered Medications  Medication Dose Route Frequency Provider Last Rate Last Admin  . alum & mag hydroxide-simeth (MAALOX/MYLANTA) 200-200-20 MG/5ML suspension 30 mL  30 mL Oral Q4H PRN Antonieta Pertlary, Sumayyah Custodio Lawson, MD      . chlordiazePOXIDE (LIBRIUM) capsule 25 mg  25 mg Oral  QID PRN Antonieta Pertlary, Trust Leh Lawson, MD      . cloNIDine (CATAPRES) tablet 0.1 mg  0.1 mg Oral QID Antonieta Pertlary, Nataley Bahri Lawson, MD   0.1 mg at 04/08/20 [id number]   Followed by  . [START ON 04/09/2020] cloNIDine (CATAPRES) tablet 0.1 mg  0.1 mg Oral BH-qamhs Kaidance Pantoja, Marlane MingleGreg Lawson, MD       Followed by  . [START ON 04/11/2020] cloNIDine (CATAPRES) tablet 0.1 mg  0.1 mg Oral QAC breakfast Antonieta Pertlary, Katrice Goel Lawson, MD      . dicyclomine (BENTYL) tablet 20 mg  20 mg Oral Q6H PRN Antonieta Pertlary, Logan Vegh Lawson, MD      . OLANZapine [facility](ZYPREXA) injection 5 mg  5 mg Intramuscular BID PRN Antonieta Pertlary, Khya Halls Lawson, MD       And  . diphenhydrAMINE (BENADRYL) injection 50 mg  50 mg Intramuscular BID PRN Antonieta Pertlary, Kanasia Gayman Lawson, MD      . folic acid (FOLVITE) tablet 1 mg  1 mg Oral Daily Antonieta Pertlary, Idan Prime Lawson, MD   1 mg at 04/08/20 [id number]  . hydrOXYzine (ATARAX/VISTARIL) tablet 25 mg  25 mg Oral Q6H PRN Antonieta Pertlary, Giannamarie Paulus Lawson, MD      . loperamide (IMODIUM) capsule 2-4 mg  2-4 mg Oral PRN Antonieta Pertlary, Dennis Hegeman Lawson, MD      . magnesium hydroxide (MILK OF MAGNESIA) suspension 30 mL  30 mL Oral Daily PRN Antonieta Pertlary, Rodger Giangregorio Lawson, MD      . methocarbamol (ROBAXIN) tablet 500 mg  500 mg Oral Q8H PRN Antonieta Pertlary, Deaira Leckey Lawson, MD      . nicotine (NICODERM CQ - dosed in mg/24 hours) patch 21 mg  21 mg Transdermal Daily Antonieta Pertlary, Cordelle Dahmen Lawson, MD   21 mg at 04/08/20 0925  . OLANZapine zydis (ZYPREXA) disintegrating tablet 10 mg  10 mg Oral Q8H PRN Antonieta Pertlary, Beola Vasallo Lawson, MD       And  . ziprasidone (GEODON) injection 20 mg  20 mg Intramuscular Q6H PRN Antonieta Pertlary, Bellatrix Devonshire Lawson, MD      . ondansetron (ZOFRAN-ODT) disintegrating tablet 4 mg  4 mg Oral Q6H PRN Antonieta Pertlary, Luane Rochon Lawson, MD      . thiamine tablet 100 mg  100 mg Oral Daily Antonieta Pertlary, Lewellyn Fultz Lawson, MD   100 mg at 04/08/20 0926  . venlafaxine XR (EFFEXOR-XR) 24 hr capsule 75 mg  75 mg Oral Q breakfast Antonieta Pertlary, Melburn Treiber Lawson, MD   75 mg at 04/08/20 [id number]    Lab Results:  Results for orders placed or performed during the hospital encounter of 04/07/20 (from the past 48  hour(s))  Comprehensive metabolic panel     Status: Abnormal   Collection Time: 04/07/20  1:13 AM  Result Value Ref Range   Sodium 142 135 - 145 mmol/L   Potassium 3.0 (L) 3.5 - 5.1 mmol/L   Chloride 108 98 -  111 mmol/L   CO2 20 (L) 22 - 32 mmol/L   Glucose, Bld 99 70 - 99 mg/dL    Comment: Glucose reference range applies only to samples taken after fasting for at least 8 hours.   BUN 10 6 - 20 mg/dL   Creatinine, Ser 1.61 (H) 0.44 - 1.00 mg/dL   Calcium 9.3 8.9 - 09.6 mg/dL   Total Protein 8.4 (H) 6.5 - 8.1 g/dL   Albumin 4.9 3.5 - 5.0 g/dL   AST 29 15 - 41 U/L   ALT 20 0 - 44 U/L   Alkaline Phosphatase 96 38 - 126 U/L   Total Bilirubin 0.6 0.3 - 1.2 mg/dL   GFR, Estimated >04 >54 mL/min    Comment: (NOTE) Calculated using the CKD-EPI Creatinine Equation (2021)    Anion gap 14 5 - 15    Comment: Performed at [facility], [address], [place], [place] [address]  Salicylate level     Status: Abnormal   Collection Time: 04/07/20  1:13 AM  Result Value Ref Range   Salicylate Lvl <7.0 (L) 7.0 - 30.0 mg/dL    Comment: Performed at [facility], [address], [place], [place] [address]  Acetaminophen level     Status: Abnormal   Collection Time: 04/07/20  1:13 AM  Result Value Ref Range   Acetaminophen (Tylenol), Serum <10 (L) 10 - 30 ug/mL    Comment: (NOTE) Therapeutic concentrations vary significantly. A range of 10-30 ug/mL  may be an effective concentration for many patients. However, some  are best treated at concentrations outside of this range. Acetaminophen concentrations >150 ug/mL at 4 hours after ingestion  and >50 ug/mL at 12 hours after ingestion are often associated with  toxic reactions.  Performed at [facility], [address], [place], [place] [address]   Ethanol     Status: Abnormal   Collection Time: 04/07/20  1:13 AM  Result Value Ref Range   Alcohol, Ethyl (B) 169 (H) <10 mg/dL     Comment: (NOTE) Lowest detectable limit for serum alcohol is 10 mg/dL.  For medical purposes only. Performed at [facility], [address] [place], [place] [address]   CBC WITH DIFFERENTIAL     Status: Abnormal   Collection Time: 04/07/20  1:13 AM  Result Value Ref Range   WBC 15.6 (H) 4.0 - 10.5 K/uL   RBC 4.60 3.87 - 5.11 MIL/uL   Hemoglobin 13.8 12.0 - 15.0 g/dL   HCT 57.8 46.9 - 62.9 %   MCV 89.3 80.0 - 100.0 fL   MCH 30.0 26.0 - 34.0 pg   MCHC 33.6 30.0 - 36.0 g/dL   RDW 52.8 41.3 - 24.4 %   Platelets 366 150 - 400 K/uL   nRBC 0.0 0.0 - 0.2 %   Neutrophils Relative % 75 %   Neutro Abs 11.6 (H) 1.7 - 7.7 K/uL   Lymphocytes Relative 18 %   Lymphs Abs 2.9 0.7 - 4.0 K/uL   Monocytes Relative 5 %   Monocytes Absolute 0.9 0.1 - 1.0 K/uL   Eosinophils Relative 1 %   Eosinophils Absolute 0.1 0.0 - 0.5 K/uL   Basophils Relative 0 %   Basophils Absolute 0.1 0.0 - 0.1 K/uL   Immature Granulocytes 1 %   Abs Immature Granulocytes 0.09 (H) 0.00 - 0.07 K/uL    Comment: Performed at [facility], [address], [place], [place] [address]  I-Stat  beta hCG blood, ED     Status: None   Collection Time: 04/07/20  1:44 AM  Result Value Ref Range   I-stat hCG, quantitative <5.0 <5 mIU/mL   Comment 3            Comment:   GEST. AGE      CONC.  (mIU/mL)   <=1 WEEK        5 - 50     2 WEEKS       50 - 500     3 WEEKS       100 - 10,000     4 WEEKS     1,000 - 30,000        FEMALE AND NON-PREGNANT FEMALE:     LESS THAN 5 mIU/mL   Resp Panel by RT-PCR (Flu A&B, Covid) Nasopharyngeal Swab     Status: None   Collection Time: 04/07/20  7:12 AM   Specimen: Nasopharyngeal Swab; Nasopharyngeal(NP) swabs in vial transport medium  Result Value Ref Range   SARS Coronavirus 2 by RT PCR NEGATIVE NEGATIVE    Comment: (NOTE) SARS-CoV-2 target nucleic acids are NOT DETECTED.  The SARS-CoV-2 RNA is generally detectable in upper respiratory specimens during  the acute phase of infection. The lowest concentration of SARS-CoV-2 viral copies this assay can detect is 138 copies/mL. A negative result does not preclude SARS-Cov-2 infection and should not be used as the sole basis for treatment or other patient management decisions. A negative result may occur with  improper specimen collection/handling, submission of specimen other than nasopharyngeal swab, presence of viral mutation(s) within the areas targeted by this assay, and inadequate number of viral copies(<138 copies/mL). A negative result must be combined with clinical observations, patient history, and epidemiological information. The expected result is Negative.  Fact Sheet for Patients:  BloggerCourse.com  Fact Sheet for Healthcare Providers:  SeriousBroker.it  This test is no t yet approved or cleared by the [place] FDA and  has been authorized for detection and/or diagnosis of SARS-CoV-2 by FDA under an Emergency Use Authorization (EUA). This EUA will remain  in effect (meaning this test can be used) for the duration of the COVID-19 declaration under Section 564(b)(1) of the Act, 21 U.S.C.section 360bbb-3(b)(1), unless the authorization is terminated  or revoked sooner.       Influenza A by PCR NEGATIVE NEGATIVE   Influenza B by PCR NEGATIVE NEGATIVE    Comment: (NOTE) The Xpert Xpress SARS-CoV-2/FLU/RSV plus assay is intended as an aid in the diagnosis of influenza from Nasopharyngeal swab specimens and should not be used as a sole basis for treatment. Nasal washings and aspirates are unacceptable for Xpert Xpress SARS-CoV-2/FLU/RSV testing.  Fact Sheet for Patients: BloggerCourse.com  Fact Sheet for Healthcare Providers: SeriousBroker.it  This test is not yet approved or cleared by the [place] FDA and has been authorized for detection and/or diagnosis of  SARS-CoV-2 by FDA under an Emergency Use Authorization (EUA). This EUA will remain in effect (meaning this test can be used) for the duration of the COVID-19 declaration under Section 564(b)(1) of the Act, 21 U.S.C. section 360bbb-3(b)(1), unless the authorization is terminated or revoked.  Performed at [facility], [address], [place], [place] [address]   Urine rapid drug screen (hosp performed)     Status: Abnormal   Collection Time: 04/07/20  7:58 AM  Result Value Ref Range   Opiates POSITIVE (A) NONE DETECTED   Cocaine NONE DETECTED NONE DETECTED  Benzodiazepines POSITIVE (A) NONE DETECTED   Amphetamines POSITIVE (A) NONE DETECTED   Tetrahydrocannabinol POSITIVE (A) NONE DETECTED   Barbiturates NONE DETECTED NONE DETECTED    Comment: (NOTE) DRUG SCREEN FOR MEDICAL PURPOSES ONLY.  IF CONFIRMATION IS NEEDED FOR ANY PURPOSE, NOTIFY LAB WITHIN 5 DAYS.  LOWEST DETECTABLE LIMITS FOR URINE DRUG SCREEN Drug Class                     Cutoff (ng/mL) Amphetamine and metabolites    1000 Barbiturate and metabolites    200 Benzodiazepine                 200 Tricyclics and metabolites     300 Opiates and metabolites        300 Cocaine and metabolites        300 THC                            50 Performed at [facility], [address], [place], [place] [address]     Blood Alcohol level:  Lab Results  Component Value Date   ETH 169 (H) 04/07/2020   ETH 172 (H) 06/24/2015    Metabolic Disorder Labs: Lab Results  Component Value Date   HGBA1C 5.0 06/25/2015   MPG 97 06/25/2015   Lab Results  Component Value Date   PROLACTIN 35.5 (H) 06/25/2015   Lab Results  Component Value Date   CHOL 189 06/25/2015   TRIG 207 (H) 06/25/2015   HDL 38 (L) 06/25/2015   CHOLHDL 5.0 06/25/2015   VLDL 41 (H) 06/25/2015   LDLCALC 110 (H) 06/25/2015    Physical Findings: AIMS:  , ,  ,  ,    CIWA:    COWS:  COWS Total Score:  4  Musculoskeletal: Strength & Muscle Tone: within normal limits Gait & Station: normal Patient leans: N/A  Psychiatric Specialty Exam:  Presentation  General Appearance: Disheveled  Eye Contact:Good  Speech:Clear and Coherent  Speech Volume:Normal  Handedness:Right   Mood and Affect  Mood:Euthymic  Affect:Appropriate   Thought Process  Thought Processes:Coherent  Descriptions of Associations:Intact  Orientation:Full (Time, Place and Person)  Thought Content:Logical  History of Schizophrenia/Schizoaffective disorder:No  Duration of Psychotic Symptoms:No data recorded Hallucinations:Hallucinations: None  Ideas of Reference:None  Suicidal Thoughts:Suicidal Thoughts: No SI Passive Intent and/or Plan: Without Intent  Homicidal Thoughts:Homicidal Thoughts: No   Sensorium  Memory:Remote Fair; Immediate Fair; Recent Fair  Judgment:Fair  Insight:Fair   Executive Functions  Concentration:Fair  Attention Span:Good  Recall:Fair  Fund of Knowledge:Fair  Language:Good   Psychomotor Activity  Psychomotor Activity:Psychomotor Activity: Normal   Assets  Assets:Desire for Improvement; Resilience; Housing   Sleep  Sleep:Sleep: Good Number of Hours of Sleep: 6.75    Physical Exam: Physical Exam Vitals and nursing note reviewed.  HENT:     Head: Normocephalic and atraumatic.  Pulmonary:     Effort: Pulmonary effort is normal.  Neurological:     General: No focal deficit present.     Mental Status: She is alert and oriented to person, place, and time.    ROS Blood pressure (!) 136/104, pulse (!) 103, temperature 97.7 F (36.5 C), temperature source Oral, last menstrual period 01/08/2013, SpO2 100 %. There is no height or weight on file to calculate BMI.   Treatment Plan Summary: Daily contact with patient to assess and evaluate symptoms and progress in treatment, Medication management and Plan :  Patient is seen and examined.  Patient is  a 45 year old female with the above-stated past psychiatric history who is seen in follow-up.   Diagnosis: 1.  Substance-induced mood disorder. 2.  History of bipolar disorder. 3.  Polysubstance dependence 4.  Posttraumatic stress disorder 5.  Polysubstance withdrawal syndrome uncomplicated  Pertinent findings on examination today: 1.  Patient denies suicidal ideation. 2.  Withdrawal syndrome is minimal at least at this point. 3.  Patient is requesting discharge.  Plan: 1.  Continue Librium 25 mg p.o. 4 times daily as needed withdrawal syndrome with that CIWA greater than 10. 2.  Continue the clinical opiate withdrawal score every 8 hours. 3.  Continue clonidine detox protocol for opiates. 4.  Continue folic acid 1 mg p.o. daily for nutritional supplementation. 5.  Continue Vistaril 25 mg p.o. every 6 hours as needed anxiety. 6.  Continue Zyprexa agitation protocol as needed. 7.  Restart quetiapine at 50 mg p.o. twice daily as needed agitation and 200 mg p.o. nightly for mood stability. 8.  Continue thiamine 100 mg p.o. daily for nutritional supplementation. 9.  Continue venlafaxine extended release 75 mg p.o. daily for depression. 10.  Disposition planning-if patient continues in the next 24 hours and is hemodynamically stable, without any overt withdrawal syndromes we will consider discharge tomorrow.  Antonieta Pert, MD 04/08/2020, 11:36 AM

## 2020-04-08 NOTE — [facility] Group Notes (Signed)
LCSW Aftercare Discharge Planning Group Note  04/08/2020   Type of Group and Topic: Psychoeducational Group: Discharge Planning  Participation Level: Did Not Attend  Description of Group: Discharge planning group reviews patient's anticipated discharge plans and assists patients to anticipate and address any barriers to wellness/recovery in the community. Suicide prevention education is reviewed with patients in group.  Therapeutic Goals  1. Patients will state their anticipated discharge plan and mental health aftercare 2. Patients will identify potential barriers to wellness in the community setting 3. Patients will engage in problem solving, solution focused discussion of ways to anticipate and address barriers to wellness/recovery  Summary of Patient Progress: Did Not Attend  Supports:  -Therapeutic Modalities:  -Motivational Interviewing  Reegan Bouffard, LCSWA Clinicial Social Worker [facility] 

## 2020-04-08 NOTE — B[facility] Group Notes (Signed)
Patient did not attend morning group.  

## 2020-04-09 DIAGNOSIS — F314 Bipolar disorder, current episode depressed, severe, without psychotic features: Principal | ICD-10-CM

## 2020-04-09 MED ORDER — VENLAFAXINE HCL ER 75 MG PO CP24: 75.0000 mg | ORAL_CAPSULE | Freq: Every day | ORAL | 0 refills | Status: DC

## 2020-04-09 MED ORDER — HYDROXYZINE HCL 25 MG PO TABS: 25.0000 mg | ORAL_TABLET | Freq: Four times a day (QID) | ORAL | 0 refills | Status: DC | PRN

## 2020-04-09 MED ORDER — QUETIAPINE FUMARATE 50 MG PO TABS: 50.0000 mg | ORAL_TABLET | Freq: Two times a day (BID) | ORAL | 0 refills | Status: DC | PRN

## 2020-04-09 MED ORDER — QUETIAPINE FUMARATE 200 MG PO TABS: 200.0000 mg | ORAL_TABLET | Freq: Every day | ORAL | 0 refills | Status: DC

## 2020-04-09 NOTE — Progress Notes (Signed)
  [facility] Adult Case Management Discharge Plan :  Will you be returning to the same living situation after discharge:  Yes,  with spouse At discharge, do you have transportation home?: Yes,  husband to pick up at 10:00am Do you have the ability to pay for your medications: No.  Release of information consent forms completed and emailed to Medical Records, then turned in to Medical Records by CSW.   Patient to Follow up at:  Follow-up Information    [facility]. Go on 04/13/2020.   Specialty: Behavioral Health Why: You have a walk in appointment on 04/13/20 at 7:45 am for therapy services.  You also have an appointment on 05/05/20 at 1:40 pm for medication management.  This appointment will be held in person. Contact information: [address] [place] [address] [phone]              Next level of care provider has access to [facility] Link:yes  Safety Planning and Suicide Prevention discussed: Yes,  with husband     Has patient been referred to the Quitline?: Patient refused referral  Patient has been referred for addiction treatment: Yes  Lynnell Chad, LCSW 04/09/2020, 9:04 AM

## 2020-04-09 NOTE — Progress Notes (Signed)
   04/09/20 0933  Vital Signs  Temp 97.8 F (36.6 C)  Temp Source Oral  Pulse Rate 63  Pulse Rate Source Monitor  BP 129/72  BP Method Automatic  Oxygen Therapy  SpO2 99 %  Discharge Note:  Patient denies SI/HI AVH at this time. Discharge instructions, AVS, prescriptions and transition record gone over with patient. Patient agrees to comply with medication management, follow-up visit, and outpatient therapy. Patient belongings returned to patient. Patient questions and concerns addressed and answered.  Patient ambulatory off unit.  Patient discharged to home with husband.

## 2020-04-09 NOTE — Progress Notes (Signed)
   04/09/20 0200  Psych Admission Type (Psych Patients Only)  Admission Status Involuntary  Psychosocial Assessment  Patient Complaints None  Eye Contact Brief  Facial Expression Flat  Affect Appropriate to circumstance  Speech Logical/coherent  Interaction Minimal  Motor Activity Slow  Appearance/Hygiene Disheveled;Poor hygiene  Thought Process  Coherency WDL  Content WDL  Delusions None reported or observed  Perception WDL  Hallucination None reported or observed  Judgment Impaired  Confusion WDL  Danger to Self  Current suicidal ideation? Denies  Danger to Others  Danger to Others None reported or observed

## 2020-04-09 NOTE — BHH Suicide Risk Assessment (Signed)
[facility] INPATIENT:  Family/Significant Other Suicide Prevention Education  Suicide Prevention Education:  Education Completed; Sonda Primes. [phone] (husband),  (name of family member/significant other) has been identified by the patient as the family member/significant other with whom the patient will be residing, and identified as the person(s) who will aid the patient in the event of a mental health crisis (suicidal ideations/suicide attempt).  With written consent from the patient, the family member/significant other has been provided the following suicide prevention education, prior to the and/or following the discharge of the patient.  The suicide prevention education provided includes the following:  Suicide risk factors  Suicide prevention and interventions  National Suicide Hotline telephone number  [facility] assessment telephone number  [facility] Emergency Assistance 911  [facility] and/or Residential Mobile Crisis Unit telephone number  Request made of family/significant other to:  Remove weapons (e.g., guns, rifles, knives), all items previously/currently identified as safety concern.    Remove drugs/medications (over-the-counter, prescriptions, illicit drugs), all items previously/currently identified as a safety concern.  The family member/significant other verbalizes understanding of the suicide prevention education information provided.  The family member/significant other agrees to remove the items of safety concern listed above.  Husband stated he has been through this before with patient and is well aware of suicide precautions, risks, and warning signs, as well as what to do in the event he is concerned about patient.  He stated that what she really needs to do to get well is to "lay off the alcohol."  Lynnell Chad 04/09/2020, 9:12 AM

## 2020-04-09 NOTE — BHH Suicide Risk Assessment (Signed)
[facility] Discharge Suicide Risk Assessment   Principal Problem: <principal problem not specified> Discharge Diagnoses: Active Problems:   Bipolar affective disorder, depressed, severe (HCC)   Total Time spent with patient: 20 minutes  Musculoskeletal: Strength & Muscle Tone: within normal limits Gait & Station: normal Patient leans: N/A  Psychiatric Specialty Exam: Review of Systems  All other systems reviewed and are negative.   Blood pressure 120/66, pulse 84, temperature 97.7 F (36.5 C), temperature source Oral, last menstrual period 01/08/2013, SpO2 98 %.There is no height or weight on file to calculate BMI.  General Appearance: Disheveled  Eye Solicitor::  Fair  Speech:  Normal Rate409  Volume:  Normal  Mood:  Euthymic  Affect:  Congruent  Thought Process:  Coherent and Descriptions of Associations: Intact  Orientation:  Full (Time, Place, and Person)  Thought Content:  Logical  Suicidal Thoughts:  No  Homicidal Thoughts:  No  Memory:  Immediate;   Good Recent;   Good Remote;   Good  Judgement:  Fair  Insight:  Fair  Psychomotor Activity:  Normal  Concentration:  Fair  Recall:  Fair  Fund of Knowledge:Fair  Language: Good  Akathisia:  Negative  Handed:  Right  AIMS (if indicated):     Assets:  Desire for Improvement Housing Resilience  Sleep:  Number of Hours: 5.25  Cognition: WNL  ADL's:  Intact   Mental Status Per Nursing Assessment::   On Admission:  NA  Demographic Factors:  Divorced or widowed, Caucasian, Low socioeconomic status, Living alone and Unemployed  Loss Factors: Loss of significant relationship and Financial problems/change in socioeconomic status  Historical Factors: Impulsivity  Risk Reduction Factors:   Positive coping skills or problem solving skills  Continued Clinical Symptoms:  Bipolar Disorder:   Mixed State Alcohol/Substance Abuse/Dependencies  Cognitive Features That Contribute To Risk:  None    Suicide Risk:   Minimal: No identifiable suicidal ideation.  Patients presenting with no risk factors but with morbid ruminations; may be classified as minimal risk based on the severity of the depressive symptoms   Follow-up Information    [facility]. Go on 04/13/2020.   Specialty: Behavioral Health Why: You have a walk in appointment on 04/13/20 at 7:45 am for therapy services.  You also have an appointment on 05/05/20 at 1:40 pm for medication management.  This appointment will be held in person. Contact information: [address] [place] [address] [phone]              Plan Of Care/Follow-up recommendations:  Activity:  ad lib  Antonieta Pert, MD 04/09/2020, 8:22 AM

## 2020-04-09 NOTE — Discharge Summary (Signed)
Physician Discharge Summary Note  Patient:  Kaitlyn Shepherd is an 45 y.o., female MRN:  [id number] DOB:  Feb 02, 1975 Patient phone:  [phone] (home)  Patient address:   [address] [place] [address],  Total Time spent with patient: Greater than 30 minutes  Date of Admission:  04/07/2020 Date of Discharge: 04-09-20  Reason for Admission: Worsening erratic behaviors, patient cited was triggered by the date of the anniversary of her son's suicide.   Principal Problem: Bipolar affective disorder, depressed, severe (HCC)  Discharge Diagnoses: Principal Problem:   Bipolar affective disorder, depressed, severe (HCC)  Past Psychiatric History: Bipolar affective disorder.  Past Medical History:  Past Medical History:  Diagnosis Date  . Anxiety   . Bipolar 1 disorder (HCC)   . Migraine   . Panic attacks   . Seizures (HCC)     Past Surgical History:  Procedure Laterality Date  . TUBAL LIGATION     Family History: History reviewed. No pertinent family history.  Family Psychiatric  History: See H&P  Social History:  Social History   Substance and Sexual Activity  Alcohol Use Yes   Comment: heavy     Social History   Substance and Sexual Activity  Drug Use Yes  . Types: Marijuana, Methamphetamines    Social History   Socioeconomic History  . Marital status: Divorced    Spouse name: Not on file  . Number of children: Not on file  . Years of education: Not on file  . Highest education level: Not on file  Occupational History  . Not on file  Tobacco Use  . Smoking status: Current Every Day Smoker  . Smokeless tobacco: Never Used  Vaping Use  . Vaping Use: Never used  Substance and Sexual Activity  . Alcohol use: Yes    Comment: heavy  . Drug use: Yes    Types: Marijuana, Methamphetamines  . Sexual activity: Yes  Other Topics Concern  . Not on file  Social History Narrative  . Not on file   Social Determinants of Health   Financial  Resource Strain: Not on file  Food Insecurity: Not on file  Transportation Needs: Not on file  Physical Activity: Not on file  Stress: Not on file  Social Connections: Not on file   Hospital Course: (Per Md's admission evaluation notes): Patient is a 45 year old female with a past psychiatric history significant for bipolar disorder as well as polysubstance use disorders who presented to the [facility] emergency department on 04/07/2020 via EMS from home. The patient was apparently very erratic, and the patient stated that she was upset over this being the date of the anniversary of her son's suicide. She had apparently taken an unknown amount of Xanax, alcohol, methamphetamines. EMS gave the patient 5 mg of midazolam as well as 5 mg Haldol secondary to her agitation. The patient had been placed in involuntary commitment by the emergency room physician after the patient had attempted to elope from the emergency department. The evaluation by the comprehensive clinical assessment team was vague secondary to the patient's sedation. She was noted to be drowsy but cooperative. She stated that yesterday was the fifth anniversary of the death of her son by suicide. She admitted to drinking too much alcohol last night. Although her drug screen had the presence of benzodiazepines, opiates, amphetamines and marijuana she denied having used any methamphetamines or opiates. Her blood alcohol on admission was 169. She was transferred to our facility for evaluation  and stabilization. Her examination today still remains rather limited. She is laying in the bed, and was sleeping comfortably. She woke up enough to tell me about the anniversary of the death of her son. She did also state that she was sweating. She is followed in the outpatient clinic at the behavioral health urgent care center by Dr. Evelene Croon.The patient stated today that she had been compliant with her medication she was  receiving from Dr. Evelene Croon.Her last video visit was on 12/02/2019. Her medications at that time included venlafaxine extended release 37.5 mg 3 capsules a day, Seroquel 300 mg p.o. nightly and 50 mg p.o. twice daily. Review of the PMP database revealed no recent prescriptions for any controlled substances. She was admitted to the hospital for evaluation and stabilization.   This is the second psychiatric admission/discharge summaries from this [facility] for this 45 year old female. She was a patient in this [facility] in June of 2017 for psychiatric evaluation & treatments. At that time, she was treated & discharged with an outpatient psychiatric referral for medication management. Her UDS on this present admission was positive for Amphetamine, Benzodiazepine, Opioid & THC. Her BAL was 169 per toxicology reports. She was brought to the hospital this time for worsening erratic behaviors. Patient cited was triggered by the date of the anniversary of her son's suicide. She was recommended for mood stabilization treatments.   After evaluation of her presenting symptoms, it was jointly agreed by the treatment team to recommend [facility] for mood stabilization treatments. The medication regimen targeting those presenting symptoms were discussed & initiated with her consent. She received detoxification treatments for substance withdrawal management on a prn basis as she was not showing active withdrawal symptoms on her arrival to the [facility] & throughout her hospitalization. She was however, treated, stabilized & discharged on the medications as listed below on her discharge medication lists. Kaitlyn Shepherd was also enrolled & participated in the group counseling sessions being offered & held on this unit. She learned coping skills. She presented no other significant pre-existing medical issues that required treatment  & or monitoring. She tolerated her treatment regimen without any adverse effects or reactions reported.    Kaitlyn Shepherd's  symptoms responded well to her treatment regimen. This is evidenced by her reports of improved mood, resolution of symptoms, presentation of good affect/eye contact & absence of substance withdrawal symptoms. She presents currently mentally & medically stable for discharge to continue mental health care on an outpatient basis as recommended below.    Today upon her discharge evaluation with the attending psychiatrist, Arfa shares she is doing. She denies any specific concerns. She is sleeping well. Her appetite is good. She denies any other physical complaints. She denies SI/HI/AH/VH, delusional thoughts or paranoia. She is tolerating her medications well & in agreement to continue her current regimen as recommended. She will follow up for routine psychiatric care, further substance abuse treatment & medication management as noted below. She is provided with all the necessary information needed to make these appointments without problems. She was able to engage in safety planning including plan to return to [facility] or contact emergency services if she feels unable to maintain her own safety or the safety of others. Pt had no further questions, comments or concerns. She left [facility] with all personal belongings in no apparent distress. Transportation per her husband.  Physical Findings: AIMS:  , ,  ,  ,    CIWA:  CIWA-Ar Total: 0 COWS:  COWS Total Score: 1  Musculoskeletal: Strength & Muscle Tone: within normal limits Gait & Station: normal Patient leans: N/A  Psychiatric Specialty Exam:  Presentation  General Appearance: Fairly Groomed  Eye Contact:Good  Speech:Clear and Coherent  Speech Volume:Normal  Handedness:Right  Mood and Affect  Mood:Euthymic  Affect:Appropriate  Thought Process  Thought Processes:Coherent; Linear  Descriptions of Associations:Intact  Orientation:Full (Time, Place and Person)  Thought Content:Logical  History of Schizophrenia/Schizoaffective  disorder:No  Duration of Psychotic Symptoms:NA Hallucinations:Hallucinations: None  Ideas of Reference:None  Suicidal Thoughts:Suicidal Thoughts: No SI Passive Intent and/or Plan: Without Access to Means; Without Means to Carry Out; Without Plan; Without Intent  Homicidal Thoughts:Homicidal Thoughts: No  Sensorium  Memory:Immediate Good; Recent Good; Remote Good  Judgment:Good  Insight:Good  Executive Functions  Concentration:Good  Attention Span:Good  Recall:Good  Fund of Knowledge:Good  Language:Good  Psychomotor Activity  Psychomotor Activity:Psychomotor Activity: Normal  Assets  Assets:Communication Skills; Desire for Improvement; Housing; Resilience; Social Support  Sleep  Sleep:Sleep: Good Number of Hours of Sleep: 6.75  Physical Exam: Physical Exam Vitals and nursing note reviewed.  HENT:     Head: Normocephalic.     Nose: Nose normal.     Mouth/Throat:     Pharynx: Oropharynx is clear.  Eyes:     Pupils: Pupils are equal, round, and reactive to light.  Cardiovascular:     Rate and Rhythm: Normal rate.     Pulses: Normal pulses.  Pulmonary:     Effort: Pulmonary effort is normal.  Genitourinary:    Comments: Deferred Musculoskeletal:        General: Normal range of motion.     Cervical back: Normal range of motion.  Skin:    General: Skin is warm and dry.  Neurological:     General: No focal deficit present.     Mental Status: She is alert and oriented to person, place, and time. Mental status is at baseline.    Review of Systems  Constitutional: Negative for chills and fever.  HENT: Negative for congestion and sore throat.   Eyes: Negative for blurred vision.  Respiratory: Negative for cough, shortness of breath and wheezing.   Cardiovascular: Negative for chest pain and palpitations.  Gastrointestinal: Negative for abdominal pain, constipation, diarrhea, heartburn, nausea and vomiting.  Genitourinary: Negative for dysuria.   Musculoskeletal: Negative for joint pain and myalgias.  Skin: Negative.   Neurological: Negative for dizziness, tingling, tremors, sensory change, speech change, focal weakness, seizures, loss of consciousness, weakness and headaches.  Endo/Heme/Allergies:       Allergies: See the allergy lists.  Psychiatric/Behavioral: Positive for depression (Hx. of (Stabilized with medication prior to discharge)) and substance abuse (Hx. (Amphetaminie, Benzo, opioid & THC use disorders).). Negative for hallucinations, memory loss and suicidal ideas. The patient has insomnia (Hx. of (stable upon discharge)). The patient is not nervous/anxious (Stable upon discharge).    Blood pressure 116/79, pulse 75, temperature 97.8 F (36.6 C), temperature source Oral, last menstrual period 01/08/2013, SpO2 99 %. There is no height or weight on file to calculate BMI.  Has this patient used any form of tobacco in the last 30 days? (Cigarettes, Smokeless Tobacco, Cigars, and/or Pipes): an FDA-approved tobacco cessation medication was offered at discharge.  Blood Alcohol level:  Lab Results  Component Value Date   ETH 169 (H) 04/07/2020   ETH 172 (H) 06/24/2015   Metabolic Disorder Labs:  Lab Results  Component Value Date   HGBA1C 5.0 06/25/2015   MPG 97 06/25/2015   Lab Results  Component Value  Date   PROLACTIN 35.5 (H) 06/25/2015   Lab Results  Component Value Date   CHOL 189 06/25/2015   TRIG 207 (H) 06/25/2015   HDL 38 (L) 06/25/2015   CHOLHDL 5.0 06/25/2015   VLDL 41 (H) 06/25/2015   LDLCALC 110 (H) 06/25/2015   See Psychiatric Specialty Exam and Suicide Risk Assessment completed by Attending Physician prior to discharge.  Discharge destination:  Home  Is patient on multiple antipsychotic therapies at discharge:  No   Has Patient had three or more failed trials of antipsychotic monotherapy by history:  No  Recommended Plan for Multiple Antipsychotic Therapies: NA  Allergies as of 04/09/2020       Reactions   Ativan [lorazepam] Other (See Comments)   Pt reports this medication gives her panic attacks   Buspar [buspirone] Other (See Comments)   Pt reports panic attack after taking.   Darvocet [propoxyphene N-acetaminophen] Itching   Lithium Other (See Comments)   Abnormal bloodwork   Toradol [ketorolac Tromethamine] Rash   Itching       Medication List    TAKE these medications     Indication  hydrOXYzine 25 MG tablet Commonly known as: ATARAX/VISTARIL Take 1 tablet (25 mg total) by mouth every 6 (six) hours as needed for anxiety.  Indication: Feeling Anxious   nicotine 21 mg/24hr patch Commonly known as: NICODERM CQ - dosed in mg/24 hours Place 1 patch (21 mg total) onto the skin daily.  Indication: Alzheimer's Disease   QUEtiapine 50 MG tablet Commonly known as: SEROQUEL Take 1 tablet (50 mg total) by mouth 2 (two) times daily as needed. For agitation What changed:   medication strength  how much to take  when to take this  reasons to take this  additional instructions  Indication: Agitation   QUEtiapine 200 MG tablet Commonly known as: SEROQUEL Take 1 tablet (200 mg total) by mouth at bedtime. For mood control What changed:   medication strength  how much to take  when to take this  additional instructions  Indication: Mood control   venlafaxine XR 75 MG 24 hr capsule Commonly known as: EFFEXOR-XR Take 1 capsule (75 mg total) by mouth daily with breakfast. For depression Start taking on: April 10, 2020 What changed:   medication strength  how much to take  how to take this  when to take this  additional instructions  Indication: Major Depressive Disorder       Follow-up Information    [facility].   Specialty: Behavioral Health Why: You have a walk in appointment on 04/13/20 at 7:45 am for therapy services.  You also have an appointment on 05/05/20 at 1:40 pm for medication management.   This appointment will be held in person. Contact information: [address] [place] [id number] [phone]             Follow-up recommendations: Activity:  As tolerated Diet: As recommended by your primary care doctor. Keep all scheduled follow-up appointments as recommended.  Comments: Prescriptions given at discharge.  Patient agreeable to plan.  Given opportunity to ask questions.  Appears to feel comfortable with discharge denies any current suicidal or homicidal thought. Patient is also instructed prior to discharge to: Take all medications as prescribed by his/her mental healthcare provider. Report any adverse effects and or reactions from the medicines to his/her outpatient provider promptly. Patient has been instructed & cautioned: To not engage in alcohol and or illegal drug use while  on prescription medicines. In the event of worsening symptoms, patient is instructed to call the crisis hotline, 911 and or go to the nearest ED for appropriate evaluation and treatment of symptoms. To follow-up with his/her primary care provider for your other medical issues, concerns and or health care needs.  Signed: Armandina Stammer, NP, PMHNP, FNP-BC 04/09/2020, 9:35 AM

## 2020-05-05 ENCOUNTER — Other Ambulatory Visit: Payer: Self-pay

## 2020-05-05 ENCOUNTER — Telehealth (HOSPITAL_COMMUNITY): Payer: No Payment, Other | Admitting: Psychiatry

## 2020-07-22 ENCOUNTER — Telehealth (INDEPENDENT_AMBULATORY_CARE_PROVIDER_SITE_OTHER): Payer: No Payment, Other | Admitting: Psychiatry

## 2020-07-22 ENCOUNTER — Encounter (HOSPITAL_COMMUNITY): Payer: Self-pay | Admitting: Psychiatry

## 2020-07-22 DIAGNOSIS — F3132 Bipolar disorder, current episode depressed, moderate: Secondary | ICD-10-CM | POA: Diagnosis not present

## 2020-07-22 DIAGNOSIS — F17200 Nicotine dependence, unspecified, uncomplicated: Secondary | ICD-10-CM

## 2020-07-22 DIAGNOSIS — F419 Anxiety disorder, unspecified: Secondary | ICD-10-CM | POA: Diagnosis not present

## 2020-07-22 MED ORDER — QUETIAPINE FUMARATE 50 MG PO TABS: 50.0000 mg | ORAL_TABLET | Freq: Two times a day (BID) | ORAL | 3 refills | Status: AC | PRN

## 2020-07-22 MED ORDER — VENLAFAXINE HCL ER 75 MG PO CP24: 75.0000 mg | ORAL_CAPSULE | Freq: Every day | ORAL | 3 refills | Status: AC

## 2020-07-22 MED ORDER — QUETIAPINE FUMARATE 200 MG PO TABS
200.0000 mg | ORAL_TABLET | Freq: Every day | ORAL | 3 refills | Status: AC
  Filled 2021-01-18: qty 30, 30d supply, fill #0
  Filled 2021-05-23: qty 30, 30d supply, fill #1

## 2020-07-22 MED ORDER — NICOTINE 21 MG/24HR TD PT24: 21.0000 mg | MEDICATED_PATCH | Freq: Every day | TRANSDERMAL | 3 refills | Status: AC

## 2020-07-22 MED ORDER — HYDROXYZINE HCL 25 MG PO TABS: 25.0000 mg | ORAL_TABLET | Freq: Four times a day (QID) | ORAL | 3 refills | Status: AC | PRN

## 2020-07-22 NOTE — Progress Notes (Signed)
BH MD/PA/NP OP Progress Note Virtual Visit via Video Note  I connected with Kaitlyn Shepherd on 07/22/20 at  8:30 AM EDT by a video enabled telemedicine application and verified that I am speaking with the correct person using two identifiers.  Location: Patient: Home Provider: Clinic   I discussed the limitations of evaluation and management by telemedicine and the availability of in person appointments. The patient expressed understanding and agreed to proceed.  I provided 30 minutes of non-face-to-face time during this encounter.    I provided 30 minutes of non-face-to-face time during this encounter.  07/22/2020 8:54 AM Kaitlyn Shepherd  MRN:  [id number]  Chief Complaint: "I am better but I lost my mother"  HPI: 44 year old female seen today for follow up psychiatric evaluation. She is a former patient of Dr. M. Kaur who is being transferred to writer for medication management. She was recently admitted at [facility] on 04/05/2020 for erratic behavior. She has a psychiatric history of PTSD, alcohol use, bipolar affective disorder, anxiety, depression, cocaine, alcohol and cannabis use disorder, Methamphetamine misuse. Currently she is managed on hydroxyzine 25 mg ever 6 hours, Nicodern CQ 21 mg patch, Seroquel 200 mg nightly, Seroquel 50 mg twice daily, and Effexor 75 mg daily. She notes that she has been out of medications for a week.   Today she is well groomed pleasant, cooperative, and engaged in conversation. She informed writer that since leaving the hospital she has been compliant with her medications but ran out of them a week ago. She notes that since being off her medications she has only been able to sleep 30 minutes at a time. She informed writer that recently she has been more sad because her mother passed away. She notes that she now worries about death. Today provider conducted a GAD 7 and patient scored an 18. Provider also conducted a PHQ 9 and patient scored a 16. She endorses  adequate appetite. Today she denies SI/HI/VAH, mania, or paranoia. She denies illegal drug use today.  Today patient is agreeable to restarting her medications. Provider recommended patient speak to a therapist but she notes that at this time she is not interested. No other concerns noted at this time.   Visit Diagnosis:    ICD-10-CM   1. Bipolar affective disorder, currently depressed, moderate (HCC)  F31.32 QUEtiapine (SEROQUEL) 200 MG tablet    QUEtiapine (SEROQUEL) 50 MG tablet    venlafaxine XR (EFFEXOR-XR) 75 MG 24 hr capsule    2. Anxiety  F41.9 hydrOXYzine (ATARAX/VISTARIL) 25 MG tablet    3. Tobacco use disorder  F17.200 nicotine (NICODERM CQ - DOSED IN MG/24 HOURS) 21 mg/24hr patch      Past Psychiatric History: PTSD, alcohol use, bipolar affective disorder, anxiety, depression, cocaine, alcohol and cannabis use disorder  Past Medical History:  Past Medical History:  Diagnosis Date   Anxiety    Bipolar 1 disorder (HCC)    Migraine    Panic attacks    Seizures (HCC)     Past Surgical History:  Procedure Laterality Date   TUBAL LIGATION      Family Psychiatric History: Mother Bipolar, Maternal aunt bipolar disorder, maternal uncle bipolar disorder   Family History: History reviewed. No pertinent family history.  Social History:  Social History   Socioeconomic History   Marital status: Divorced    Spouse name: Not on file   Number of children: Not on file   Years of education: Not on file   Highest education level: Not   on file  Occupational History   Not on file  Tobacco Use   Smoking status: Every Day   Smokeless tobacco: Never  Vaping Use   Vaping Use: Never used  Substance and Sexual Activity   Alcohol use: Yes    Comment: heavy   Drug use: Yes    Types: Marijuana, Methamphetamines   Sexual activity: Yes  Other Topics Concern   Not on file  Social History Narrative   Not on file   Social Determinants of Health   Financial Resource Strain: Not  on file  Food Insecurity: Not on file  Transportation Needs: Not on file  Physical Activity: Not on file  Stress: Not on file  Social Connections: Not on file    Allergies:  Allergies  Allergen Reactions   Ativan [Lorazepam] Other (See Comments)    Pt reports this medication gives her panic attacks   Buspar [Buspirone] Other (See Comments)    Pt reports panic attack after taking.   Darvocet [Propoxyphene N-Acetaminophen] Itching   Lithium Other (See Comments)    Abnormal bloodwork   Toradol [Ketorolac Tromethamine] Rash    Itching     Metabolic Disorder Labs: Lab Results  Component Value Date   HGBA1C 5.0 06/25/2015   MPG 97 06/25/2015   Lab Results  Component Value Date   PROLACTIN 35.5 (H) 06/25/2015   Lab Results  Component Value Date   CHOL 189 06/25/2015   TRIG 207 (H) 06/25/2015   HDL 38 (L) 06/25/2015   CHOLHDL 5.0 06/25/2015   VLDL 41 (H) 06/25/2015   LDLCALC 110 (H) 06/25/2015   Lab Results  Component Value Date   TSH 2.524 06/25/2015    Therapeutic Level Labs: No results found for: LITHIUM No results found for: VALPROATE No components found for:  CBMZ  Current Medications: Current Outpatient Medications  Medication Sig Dispense Refill   hydrOXYzine (ATARAX/VISTARIL) 25 MG tablet Take 1 tablet (25 mg total) by mouth every 6 (six) hours as needed for anxiety. 90 tablet 3   nicotine (NICODERM CQ - DOSED IN MG/24 HOURS) 21 mg/24hr patch Place 1 patch (21 mg total) onto the skin daily. 28 patch 3   QUEtiapine (SEROQUEL) 200 MG tablet Take 1 tablet (200 mg total) by mouth at bedtime. For mood control 30 tablet 3   QUEtiapine (SEROQUEL) 50 MG tablet Take 1 tablet (50 mg total) by mouth 2 (two) times daily as needed. For agitation 60 tablet 3   venlafaxine XR (EFFEXOR-XR) 75 MG 24 hr capsule Take 1 capsule (75 mg total) by mouth daily with breakfast. For depression 30 capsule 3   No current facility-administered medications for this visit.      Musculoskeletal: Strength & Muscle Tone:  Unable to assess due to telehealth visit Gait & Station: Unable to assess due to telehealth visit Patient leans: N/A  Psychiatric Specialty Exam: Review of Systems  Last menstrual period 01/08/2013.There is no height or weight on file to calculate BMI.  General Appearance: Well Groomed  Eye Contact:  Good  Speech:  Clear and Coherent and Normal Rate  Volume:  Normal  Mood:  Anxious and Depressed  Affect:  Appropriate and Congruent  Thought Process:  Coherent, Goal Directed, and Linear  Orientation:  Full (Time, Place, and Person)  Thought Content: WDL and Logical   Suicidal Thoughts:  No  Homicidal Thoughts:  No  Memory:  Immediate;   Good Recent;   Good Remote;   Good  Judgement:  Good  Insight:  Good  Psychomotor Activity:  Normal  Concentration:  Concentration: Good and Attention Span: Good  Recall:  Good  Fund of Knowledge: Good  Language: Good  Akathisia:  No  Handed:  Right  AIMS (if indicated): not done  Assets:  Communication Skills Desire for Improvement Financial Resources/Insurance Housing Physical Health Social Support  ADL's:  Intact  Cognition: WNL  Sleep:  Poor   Screenings: AIMS    Flowsheet Row [facility]  AIMS Total Score 0      AUDIT    Flowsheet Row [facility]  Alcohol Use Disorder Identification Test Final Score (AUDIT) 1      GAD-7    Flowsheet Row Video Visit from 07/22/2020 in [facility]  Total GAD-7 Score 18      PHQ2-9    Flowsheet Row Video Visit from 07/22/2020 in [facility]  PHQ-2 Total Score 4  PHQ-9 Total Score 16      Flowsheet Row [facility] Most recent reading at 04/07/2020  5:00 PM  ED from 04/07/2020 in [facility] Most recent reading at 04/07/2020 10:39 AM  C-SSRS RISK CATEGORY Error: Q3, 4, or 5 should not be populated when Q2 is No High Risk        Assessment and Plan: Patient endorses symptoms of anxiety, depression, and insomnia. Today patient is agreeable to restarting her medications. Provider recommended patient speak to a therapist but she notes that at this time she is not interested. No other concerns noted at this time.   1. Bipolar affective disorder, currently depressed, moderate (HCC)  Restart- QUEtiapine (SEROQUEL) 200 MG tablet; Take 1 tablet (200 mg total) by mouth at bedtime. For mood control  Dispense: 30 tablet; Refill: 3 Restart- QUEtiapine (SEROQUEL) 50 MG tablet; Take 1 tablet (50 mg total) by mouth 2 (two) times daily as needed. For agitation  Dispense: 60 tablet; Refill: 3 Restart- venlafaxine XR (EFFEXOR-XR) 75 MG 24 hr capsule; Take 1 capsule (75 mg total) by mouth daily with breakfast. For depression  Dispense: 30 capsule; Refill: 3  2. Anxiety  Restart- hydrOXYzine (ATARAX/VISTARIL) 25 MG tablet; Take 1 tablet (25 mg total) by mouth every 6 (six) hours as needed for anxiety.  Dispense: 90 tablet; Refill: 3  3. Tobacco use disorder  Restart- nicotine (NICODERM CQ - DOSED IN MG/24 HOURS) 21 mg/24hr patch; Place 1 patch (21 mg total) onto the skin daily.  Dispense: 28 patch; Refill: 3  Follow up in 3 months Shanna Cisco, NP 07/22/2020, 8:54 AM

## 2020-08-16 ENCOUNTER — Other Ambulatory Visit (HOSPITAL_COMMUNITY): Payer: Self-pay | Admitting: Psychiatry

## 2020-09-09 ENCOUNTER — Telehealth (HOSPITAL_COMMUNITY): Payer: No Payment, Other | Admitting: Psychiatry

## 2020-10-27 ENCOUNTER — Telehealth (HOSPITAL_COMMUNITY): Payer: No Payment, Other | Admitting: Psychiatry

## 2020-10-27 NOTE — Progress Notes (Unsigned)
BH MD/PA/NP OP Progress Note Virtual Visit via Video Note  I connected with Kaitlyn Shepherd on 07/22/20 at  8:30 AM EDT by a video enabled telemedicine application and verified that I am speaking with the correct person using two identifiers.  Location: Patient: Home Provider: Clinic   I discussed the limitations of evaluation and management by telemedicine and the availability of in person appointments. The patient expressed understanding and agreed to proceed.  I provided 30 minutes of non-face-to-face time during this encounter.    I provided 30 minutes of non-face-to-face time during this encounter.  07/22/2020 8:54 AM Kaitlyn Shepherd  MRN:  [id number]  Chief Complaint: "I am better but I lost my mother"  HPI: 45 year old female seen today for follow up psychiatric evaluation. She is a former patient of Dr. Quintella Baton who is being transferred to writer for medication management. She was recently admitted at [facility] on 04/05/2020 for erratic behavior. She has a psychiatric history of PTSD, alcohol use, bipolar affective disorder, anxiety, depression, cocaine, alcohol and cannabis use disorder, Methamphetamine misuse. Currently she is managed on hydroxyzine 25 mg ever 6 hours, Nicodern CQ 21 mg patch, Seroquel 200 mg nightly, Seroquel 50 mg twice daily, and Effexor 75 mg daily. She notes that she has been out of medications for a week.   Today she is well groomed pleasant, cooperative, and engaged in conversation. She informed Clinical research associate that since leaving the hospital she has been compliant with her medications but ran out of them a week ago. She notes that since being off her medications she has only been able to sleep 30 minutes at a time. She informed Clinical research associate that recently she has been more sad because her mother passed away. She notes that she now worries about death. Today provider conducted a GAD 7 and patient scored an 18. Provider also conducted a PHQ 9 and patient scored a 16. She endorses  adequate appetite. Today she denies SI/HI/VAH, mania, or paranoia. She denies illegal drug use today.  Today patient is agreeable to restarting her medications. Provider recommended patient speak to a therapist but she notes that at this time she is not interested. No other concerns noted at this time.   Visit Diagnosis:    ICD-10-CM   1. Bipolar affective disorder, currently depressed, moderate (HCC)  F31.32 QUEtiapine (SEROQUEL) 200 MG tablet    QUEtiapine (SEROQUEL) 50 MG tablet    venlafaxine XR (EFFEXOR-XR) 75 MG 24 hr capsule    2. Anxiety  F41.9 hydrOXYzine (ATARAX/VISTARIL) 25 MG tablet    3. Tobacco use disorder  F17.200 nicotine (NICODERM CQ - DOSED IN MG/24 HOURS) 21 mg/24hr patch      Past Psychiatric History: PTSD, alcohol use, bipolar affective disorder, anxiety, depression, cocaine, alcohol and cannabis use disorder  Past Medical History:  Past Medical History:  Diagnosis Date   Anxiety    Bipolar 1 disorder (HCC)    Migraine    Panic attacks    Seizures (HCC)     Past Surgical History:  Procedure Laterality Date   TUBAL LIGATION      Family Psychiatric History: Mother Bipolar, Maternal aunt bipolar disorder, maternal uncle bipolar disorder   Family History: History reviewed. No pertinent family history.  Social History:  Social History   Socioeconomic History   Marital status: Divorced    Spouse name: Not on file   Number of children: Not on file   Years of education: Not on file   Highest education level: Not  on file  Occupational History   Not on file  Tobacco Use   Smoking status: Every Day   Smokeless tobacco: Never  Vaping Use   Vaping Use: Never used  Substance and Sexual Activity   Alcohol use: Yes    Comment: heavy   Drug use: Yes    Types: Marijuana, Methamphetamines   Sexual activity: Yes  Other Topics Concern   Not on file  Social History Narrative   Not on file   Social Determinants of Health   Financial Resource Strain: Not  on file  Food Insecurity: Not on file  Transportation Needs: Not on file  Physical Activity: Not on file  Stress: Not on file  Social Connections: Not on file    Allergies:  Allergies  Allergen Reactions   Ativan [Lorazepam] Other (See Comments)    Pt reports this medication gives her panic attacks   Buspar [Buspirone] Other (See Comments)    Pt reports panic attack after taking.   Darvocet [Propoxyphene N-Acetaminophen] Itching   Lithium Other (See Comments)    Abnormal bloodwork   Toradol [Ketorolac Tromethamine] Rash    Itching     Metabolic Disorder Labs: Lab Results  Component Value Date   HGBA1C 5.0 06/25/2015   MPG 97 06/25/2015   Lab Results  Component Value Date   PROLACTIN 35.5 (H) 06/25/2015   Lab Results  Component Value Date   CHOL 189 06/25/2015   TRIG 207 (H) 06/25/2015   HDL 38 (L) 06/25/2015   CHOLHDL 5.0 06/25/2015   VLDL 41 (H) 06/25/2015   LDLCALC 110 (H) 06/25/2015   Lab Results  Component Value Date   TSH 2.524 06/25/2015    Therapeutic Level Labs: No results found for: LITHIUM No results found for: VALPROATE No components found for:  CBMZ  Current Medications: Current Outpatient Medications  Medication Sig Dispense Refill   hydrOXYzine (ATARAX/VISTARIL) 25 MG tablet Take 1 tablet (25 mg total) by mouth every 6 (six) hours as needed for anxiety. 90 tablet 3   nicotine (NICODERM CQ - DOSED IN MG/24 HOURS) 21 mg/24hr patch Place 1 patch (21 mg total) onto the skin daily. 28 patch 3   QUEtiapine (SEROQUEL) 200 MG tablet Take 1 tablet (200 mg total) by mouth at bedtime. For mood control 30 tablet 3   QUEtiapine (SEROQUEL) 50 MG tablet Take 1 tablet (50 mg total) by mouth 2 (two) times daily as needed. For agitation 60 tablet 3   venlafaxine XR (EFFEXOR-XR) 75 MG 24 hr capsule Take 1 capsule (75 mg total) by mouth daily with breakfast. For depression 30 capsule 3   No current facility-administered medications for this visit.      Musculoskeletal: Strength & Muscle Tone:  Unable to assess due to telehealth visit Gait & Station: Unable to assess due to telehealth visit Patient leans: N/A  Psychiatric Specialty Exam: Review of Systems  Last menstrual period 01/08/2013.There is no height or weight on file to calculate BMI.  General Appearance: Well Groomed  Eye Contact:  Good  Speech:  Clear and Coherent and Normal Rate  Volume:  Normal  Mood:  Anxious and Depressed  Affect:  Appropriate and Congruent  Thought Process:  Coherent, Goal Directed, and Linear  Orientation:  Full (Time, Place, and Person)  Thought Content: WDL and Logical   Suicidal Thoughts:  No  Homicidal Thoughts:  No  Memory:  Immediate;   Good Recent;   Good Remote;   Good  Judgement:  Good  Insight:  Good  Psychomotor Activity:  Normal  Concentration:  Concentration: Good and Attention Span: Good  Recall:  Good  Fund of Knowledge: Good  Language: Good  Akathisia:  No  Handed:  Right  AIMS (if indicated): not done  Assets:  Communication Skills Desire for Improvement Financial Resources/Insurance Housing Physical Health Social Support  ADL's:  Intact  Cognition: WNL  Sleep:  Poor   Screenings: AIMS    Flowsheet Row [facility]  AIMS Total Score 0      AUDIT    Flowsheet Row [facility]  Alcohol Use Disorder Identification Test Final Score (AUDIT) 1      GAD-7    Flowsheet Row Video Visit from 07/22/2020 in [facility]  Total GAD-7 Score 18      PHQ2-9    Flowsheet Row Video Visit from 07/22/2020 in [facility]  PHQ-2 Total Score 4  PHQ-9 Total Score 16      Flowsheet Row Ad[facility] Most recent reading at 04/07/2020  5:00 PM  ED from 04/07/2020 in [facility] Most recent reading at 04/07/2020 10:39 AM  C-SSRS RISK CATEGORY Error: Q3, 4, or 5 should not be populated when Q2 is No High Risk        Assessment and Plan: Patient endorses symptoms of anxiety, depression, and insomnia. Today patient is agreeable to restarting her medications. Provider recommended patient speak to a therapist but she notes that at this time she is not interested. No other concerns noted at this time.   1. Bipolar affective disorder, currently depressed, moderate (HCC)  Restart- QUEtiapine (SEROQUEL) 200 MG tablet; Take 1 tablet (200 mg total) by mouth at bedtime. For mood control  Dispense: 30 tablet; Refill: 3 Restart- QUEtiapine (SEROQUEL) 50 MG tablet; Take 1 tablet (50 mg total) by mouth 2 (two) times daily as needed. For agitation  Dispense: 60 tablet; Refill: 3 Restart- venlafaxine XR (EFFEXOR-XR) 75 MG 24 hr capsule; Take 1 capsule (75 mg total) by mouth daily with breakfast. For depression  Dispense: 30 capsule; Refill: 3  2. Anxiety  Restart- hydrOXYzine (ATARAX/VISTARIL) 25 MG tablet; Take 1 tablet (25 mg total) by mouth every 6 (six) hours as needed for anxiety.  Dispense: 90 tablet; Refill: 3  3. Tobacco use disorder  Restart- nicotine (NICODERM CQ - DOSED IN MG/24 HOURS) 21 mg/24hr patch; Place 1 patch (21 mg total) onto the skin daily.  Dispense: 28 patch; Refill: 3  Follow up in 3 months Shanna Cisco, NP 07/22/2020, 8:54 AM

## 2021-01-18 ENCOUNTER — Other Ambulatory Visit: Payer: Self-pay

## 2021-01-18 MED ORDER — HYDROXYZINE HCL 25 MG PO TABS
ORAL_TABLET | ORAL | 0 refills | Status: AC
  Filled 2021-01-18: qty 30, 7d supply, fill #0

## 2021-01-18 MED ORDER — VENLAFAXINE HCL ER 75 MG PO CP24
ORAL_CAPSULE | ORAL | 0 refills | Status: AC
  Filled 2021-01-18: qty 30, 30d supply, fill #0
  Filled 2021-05-23: qty 30, 30d supply, fill #1

## 2021-01-18 MED ORDER — QUETIAPINE FUMARATE 50 MG PO TABS
50.0000 mg | ORAL_TABLET | Freq: Two times a day (BID) | ORAL | 3 refills | Status: AC
  Filled 2021-05-23: qty 60, 30d supply, fill #0

## 2021-01-18 MED ORDER — QUETIAPINE FUMARATE 50 MG PO TABS
ORAL_TABLET | ORAL | 0 refills | Status: AC
  Filled 2021-01-18: qty 60, 30d supply, fill #0

## 2021-01-18 MED ORDER — HYDROXYZINE HCL 25 MG PO TABS
25.0000 mg | ORAL_TABLET | Freq: Four times a day (QID) | ORAL | 3 refills | Status: AC | PRN
  Filled 2021-05-23: qty 90, 23d supply, fill #0

## 2021-01-18 MED FILL — Gabapentin Cap 300 MG: ORAL | 30 days supply | Qty: 90 | Fill #0 | Status: AC

## 2021-01-19 ENCOUNTER — Other Ambulatory Visit: Payer: Self-pay

## 2021-01-20 ENCOUNTER — Other Ambulatory Visit (HOSPITAL_COMMUNITY): Payer: Self-pay

## 2021-02-14 ENCOUNTER — Other Ambulatory Visit: Payer: Self-pay

## 2021-02-28 ENCOUNTER — Telehealth (HOSPITAL_COMMUNITY): Payer: No Payment, Other | Admitting: Psychiatry

## 2021-03-01 ENCOUNTER — Telehealth (HOSPITAL_COMMUNITY): Payer: No Payment, Other | Admitting: Psychiatry

## 2021-03-01 ENCOUNTER — Encounter (HOSPITAL_COMMUNITY): Payer: Self-pay

## 2021-04-07 ENCOUNTER — Other Ambulatory Visit: Payer: Self-pay

## 2021-05-23 ENCOUNTER — Other Ambulatory Visit: Payer: Self-pay

## 2021-05-23 MED FILL — Gabapentin Cap 300 MG: ORAL | 30 days supply | Qty: 90 | Fill #1 | Status: AC

## 2021-05-24 ENCOUNTER — Other Ambulatory Visit: Payer: Self-pay

## 2021-06-01 ENCOUNTER — Other Ambulatory Visit: Payer: Self-pay

## 2021-06-01 ENCOUNTER — Other Ambulatory Visit (HOSPITAL_COMMUNITY): Payer: Self-pay

## 2021-06-02 ENCOUNTER — Other Ambulatory Visit: Payer: Self-pay

## 2021-06-02 ENCOUNTER — Other Ambulatory Visit (HOSPITAL_COMMUNITY): Payer: Self-pay

## 2021-06-16 ENCOUNTER — Other Ambulatory Visit (HOSPITAL_COMMUNITY): Payer: Self-pay

## 2021-07-10 ENCOUNTER — Other Ambulatory Visit (HOSPITAL_COMMUNITY): Payer: Self-pay

## 2021-07-10 ENCOUNTER — Other Ambulatory Visit: Payer: Self-pay

## 2021-07-12 ENCOUNTER — Other Ambulatory Visit (HOSPITAL_COMMUNITY): Payer: Self-pay

## 2021-07-23 ENCOUNTER — Emergency Department (HOSPITAL_COMMUNITY)
Admission: EM | Admit: 2021-07-23 | Discharge: 2021-07-23 | Disposition: A | Discharge status: Home / Self Care | Payer: Commercial Managed Care - HMO | Attending: Emergency Medicine | Admitting: Emergency Medicine

## 2021-07-23 ENCOUNTER — Other Ambulatory Visit: Payer: Self-pay

## 2021-07-23 ENCOUNTER — Encounter (HOSPITAL_COMMUNITY): Payer: Self-pay

## 2021-07-23 DIAGNOSIS — N3000 Acute cystitis without hematuria: Secondary | ICD-10-CM | POA: Insufficient documentation

## 2021-07-23 DIAGNOSIS — R300 Dysuria: Secondary | ICD-10-CM | POA: Diagnosis present

## 2021-07-23 LAB — CBC WITH DIFFERENTIAL/PLATELET
Abs Immature Granulocytes: 0.01 10*3/uL (ref 0.00–0.07)
Basophils Absolute: 0.1 10*3/uL (ref 0.0–0.1)
Basophils Relative: 1 %
Eosinophils Absolute: 0.2 10*3/uL (ref 0.0–0.5)
Eosinophils Relative: 3 %
HCT: 38.5 % (ref 36.0–46.0)
Hemoglobin: 12.8 g/dL (ref 12.0–15.0)
Immature Granulocytes: 0 %
Lymphocytes Relative: 27 %
Lymphs Abs: 1.8 10*3/uL (ref 0.7–4.0)
MCH: 30.1 pg (ref 26.0–34.0)
MCHC: 33.2 g/dL (ref 30.0–36.0)
MCV: 90.6 fL (ref 80.0–100.0)
Monocytes Absolute: 0.5 10*3/uL (ref 0.1–1.0)
Monocytes Relative: 7 %
Neutro Abs: 4.2 10*3/uL (ref 1.7–7.7)
Neutrophils Relative %: 62 %
Platelets: 310 10*3/uL (ref 150–400)
RBC: 4.25 MIL/uL (ref 3.87–5.11)
RDW: 12.3 % (ref 11.5–15.5)
WBC: 6.7 10*3/uL (ref 4.0–10.5)
nRBC: 0 % (ref 0.0–0.2)

## 2021-07-23 LAB — COMPREHENSIVE METABOLIC PANEL
ALT: 14 U/L (ref 0–44)
AST: 18 U/L (ref 15–41)
Albumin: 4 g/dL (ref 3.5–5.0)
Alkaline Phosphatase: 66 U/L (ref 38–126)
Anion gap: 8 (ref 5–15)
BUN: 14 mg/dL (ref 6–20)
CO2: 26 mmol/L (ref 22–32)
Calcium: 8.9 mg/dL (ref 8.9–10.3)
Chloride: 107 mmol/L (ref 98–111)
Creatinine, Ser: 0.66 mg/dL (ref 0.44–1.00)
GFR, Estimated: >60 mL/min (ref >60)
Glucose, Bld: 101 mg/dL — ABNORMAL HIGH (ref 70–99)
Potassium: 3.6 mmol/L (ref 3.5–5.1)
Sodium: 141 mmol/L (ref 135–145)
Total Bilirubin: 0.4 mg/dL (ref 0.3–1.2)
Total Protein: 7 g/dL (ref 6.5–8.1)

## 2021-07-23 LAB — URINALYSIS, ROUTINE W REFLEX MICROSCOPIC
Bilirubin Urine: NEGATIVE
Glucose, UA: NEGATIVE mg/dL
Hgb urine dipstick: NEGATIVE
Ketones, ur: NEGATIVE mg/dL
Nitrite: NEGATIVE
Protein, ur: 30 mg/dL — AB
Specific Gravity, Urine: 1.021 (ref 1.005–1.030)
WBC, UA: >50 WBC/hpf — ABNORMAL HIGH (ref 0–5)
pH: 7 (ref 5.0–8.0)

## 2021-07-23 LAB — LIPASE, BLOOD: Lipase: 27 U/L (ref 11–51)

## 2021-07-23 MED ORDER — CEPHALEXIN 500 MG PO CAPS: 500.0000 mg | ORAL_CAPSULE | Freq: Three times a day (TID) | ORAL | 0 refills | Status: AC

## 2021-07-23 MED ORDER — CEPHALEXIN 500 MG PO CAPS
500.0000 mg | ORAL_CAPSULE | Freq: Once | ORAL | Status: AC
  Administered 2021-07-23: 500 mg via ORAL
  Filled 2021-07-23: qty 1

## 2021-07-23 NOTE — ED Provider Triage Note (Signed)
Emergency Medicine Provider Triage Evaluation Note  Kaitlyn Shepherd , a 46 y.o. female  was evaluated in triage.  Pt complains of dysuria. Lower abd pain and dysuria x 1 week.  No n/v/d, fever, chills, vaginal bleeding or discharge  Review of Systems  Positive: As above Negative: As above  Physical Exam  BP 110/62 (BP Location: Left Arm)   Pulse 86   Temp 98.4 F (36.9 C) (Oral)   Resp 16   Ht 5\' 4"  (1.626 m)   Wt 49.9 kg   LMP 01/08/2013 Comment: pt denies pregnancy and states tubal ligation 02-28-13  SpO2 98%   BMI 18.88 kg/m  Gen:   Awake, no distress   Resp:  Normal effort  MSK:   Moves extremities without difficulty  Other:    Medical Decision Making  Medically screening exam initiated at 7:49 PM.  Appropriate orders placed.  Kaitlyn Shepherd was informed that the remainder of the evaluation will be completed by another provider, this initial triage assessment does not replace that evaluation, and the importance of remaining in the ED until their evaluation is complete.     Kaitlyn Grip, PA-C 07/23/21 1951

## 2021-07-23 NOTE — ED Provider Notes (Signed)
[facility]-EMERGENCY DEPT Provider Note   CSN: [id number] Arrival date & time: 07/23/21  1935     History  Chief Complaint  Patient presents with   Dysuria    Kaitlyn Shepherd is a 46 y.o. female.  Patient presents ER chief complaint of suprapubic fullness sensation and discomfort with burning urination ongoing for a week.  No associated flank pain or back pain.  No vomiting or diarrhea.  No fevers or chills reported.  No vaginal bleeding or discharge reported.       Home Medications Prior to Admission medications   Medication Sig Start Date End Date Taking? Authorizing Provider  cephALEXin (KEFLEX) 500 MG capsule Take 1 capsule (500 mg total) by mouth 3 (three) times daily for 7 days. 07/23/21 07/30/21 Yes Cheryll Cockayne, MD  gabapentin (NEURONTIN) 300 MG capsule TAKE 1 CAPSULE BY MOUTH THREE TIMES A DAY 08/16/20   Toy Cookey E, NP  hydrOXYzine (ATARAX) 25 MG tablet Take 1 tablet by mouth every 6 hours as needed for anxiety 04/09/20   Armandina Stammer I, NP  hydrOXYzine (ATARAX) 25 MG tablet Take 1 tablet (25 mg total) by mouth every 6 (six) hours as needed for anxiety 07/22/20   Toy Cookey E, NP  hydrOXYzine (ATARAX/VISTARIL) 25 MG tablet Take 1 tablet (25 mg total) by mouth every 6 (six) hours as needed for anxiety. 07/22/20   Shanna Cisco, NP  nicotine (NICODERM CQ - DOSED IN MG/24 HOURS) 21 mg/24hr patch Place 1 patch (21 mg total) onto the skin daily. 07/22/20   Shanna Cisco, NP  QUEtiapine (SEROQUEL) 200 MG tablet Take 1 tablet (200 mg total) by mouth at bedtime. For mood control 07/22/20   Toy Cookey E, NP  QUEtiapine (SEROQUEL) 50 MG tablet Take 1 tablet (50 mg total) by mouth 2 (two) times daily as needed. For agitation 07/22/20   Shanna Cisco, NP  QUEtiapine (SEROQUEL) 50 MG tablet Take 1 tablet by mouth twice daily 03/31/20   Zena Amos, MD  QUEtiapine (SEROQUEL) 50 MG tablet Take 1 tablet (50 mg total) by mouth 2  (two) times daily as needed for agitation 07/22/20   Shanna Cisco, NP  venlafaxine XR (EFFEXOR-XR) 75 MG 24 hr capsule Take 1 capsule (75 mg total) by mouth daily with breakfast. For depression 07/22/20   Shanna Cisco, NP  venlafaxine XR (EFFEXOR-XR) 75 MG 24 hr capsule Take 1 capsule by mouth daily with breakfast for depression 07/22/20   Shanna Cisco, NP      Allergies    Ativan [lorazepam], Buspar [buspirone], Darvocet [propoxyphene n-acetaminophen], Lithium, and Toradol [ketorolac tromethamine]    Review of Systems   Review of Systems  Constitutional:  Negative for fever.  HENT:  Negative for ear pain.   Eyes:  Negative for pain.  Respiratory:  Negative for cough.   Cardiovascular:  Negative for chest pain.  Gastrointestinal:  Negative for abdominal pain.  Genitourinary:  Negative for flank pain.  Musculoskeletal:  Negative for back pain.  Skin:  Negative for rash.  Neurological:  Negative for headaches.    Physical Exam Updated Vital Signs BP 110/62 (BP Location: Left Arm)   Pulse 86   Temp 98.4 F (36.9 C) (Oral)   Resp 16   Ht 5\' 4"  (1.626 m)   Wt 49.9 kg   LMP 01/08/2013 Comment: pt denies pregnancy and states tubal ligation 02-28-13  SpO2 98%   BMI 18.88 kg/m  Physical Exam Constitutional:  General: She is not in acute distress.    Appearance: Normal appearance.  HENT:     Head: Normocephalic.     Nose: Nose normal.  Eyes:     Extraocular Movements: Extraocular movements intact.  Cardiovascular:     Rate and Rhythm: Normal rate.  Pulmonary:     Effort: Pulmonary effort is normal.  Abdominal:     Comments: Mild suprapubic discomfort on exam.  No guarding or rebound.  Musculoskeletal:        General: Normal range of motion.     Cervical back: Normal range of motion.  Neurological:     General: No focal deficit present.     Mental Status: She is alert. Mental status is at baseline.     ED Results / Procedures / Treatments    Labs (all labs ordered are listed, but only abnormal results are displayed) Labs Reviewed  URINALYSIS, ROUTINE W REFLEX MICROSCOPIC - Abnormal; Notable for the following components:      Result Value   APPearance HAZY (*)    Protein, ur 30 (*)    Leukocytes,Ua MODERATE (*)    WBC, UA >50 (*)    Bacteria, UA RARE (*)    All other components within normal limits  COMPREHENSIVE METABOLIC PANEL - Abnormal; Notable for the following components:   Glucose, Bld 101 (*)    All other components within normal limits  URINE CULTURE  CBC WITH DIFFERENTIAL/PLATELET  LIPASE, BLOOD    EKG None  Radiology No results found.  Procedures Procedures    Medications Ordered in ED Medications  cephALEXin (KEFLEX) capsule 500 mg (has no administration in time range)    ED Course/ Medical Decision Making/ A&P                           Medical Decision Making Amount and/or Complexity of Data Reviewed Labs: ordered.  Risk Prescription drug management.   Review of record shows a video visit October 27, 2020.  Cardiac monitoring showing sinus rhythm.  Sinus x-rays includes labs White count normal chemistry normal.  Urinalysis concerning for positive leukocytes.  Positive leukocytes in the setting of dysuria and suprapubic discomfort concerning for urinary tract infection.  Will treat with antibiotics.  Advised follow-up with the primary care doctor within the week.  Advised immediate return for fevers worsening symptoms back pain or any additional concerns.        Final Clinical Impression(s) / ED Diagnoses Final diagnoses:  Acute cystitis without hematuria    Rx / DC Orders ED Discharge Orders          Ordered    cephALEXin (KEFLEX) 500 MG capsule  3 times daily        07/23/21 2108              Cheryll Cockayne, MD 07/23/21 2108

## 2021-07-23 NOTE — ED Triage Notes (Signed)
Arrives EMS from home with c/o dysuria and suprapubic painx 1 week.

## 2021-07-23 NOTE — Discharge Instructions (Addendum)
Call your primary care doctor or specialist as discussed in the next 2-3 days.   Return immediately back to the ER if:  Your symptoms worsen within the next 12-24 hours. You develop new symptoms such as new fevers, persistent vomiting, new pain, shortness of breath, or new weakness or numbness, or if you have any other concerns.  

## 2021-07-25 LAB — URINE CULTURE

## 2021-07-31 ENCOUNTER — Other Ambulatory Visit (HOSPITAL_COMMUNITY): Payer: Self-pay | Admitting: Psychiatry

## 2021-07-31 ENCOUNTER — Other Ambulatory Visit: Payer: Self-pay

## 2021-07-31 DIAGNOSIS — F3132 Bipolar disorder, current episode depressed, moderate: Secondary | ICD-10-CM

## 2021-08-01 ENCOUNTER — Other Ambulatory Visit: Payer: Self-pay

## 2021-08-01 NOTE — Telephone Encounter (Signed)
Received request for refills of patients medications.  She has not been seen in this clinic since 10/2021, therefore, we will not prescribe these medications at this time.  She will need to make an appointment and re-establish with a provider.     Arna Snipe MD Resident

## 2021-08-24 ENCOUNTER — Other Ambulatory Visit: Payer: Self-pay

## 2021-10-04 ENCOUNTER — Emergency Department (HOSPITAL_COMMUNITY)
Admission: EM | Admit: 2021-10-04 | Discharge: 2021-10-04 | Discharge status: Against Medical Advice | Payer: Commercial Managed Care - HMO | Attending: Physician Assistant | Admitting: Physician Assistant

## 2021-10-04 ENCOUNTER — Emergency Department (HOSPITAL_COMMUNITY): Payer: Commercial Managed Care - HMO

## 2021-10-04 ENCOUNTER — Encounter (HOSPITAL_COMMUNITY): Payer: Self-pay | Admitting: Emergency Medicine

## 2021-10-04 DIAGNOSIS — Z4800 Encounter for change or removal of nonsurgical wound dressing: Secondary | ICD-10-CM | POA: Insufficient documentation

## 2021-10-04 DIAGNOSIS — Z5321 Procedure and treatment not carried out due to patient leaving prior to being seen by health care provider: Secondary | ICD-10-CM | POA: Insufficient documentation

## 2021-10-04 LAB — BASIC METABOLIC PANEL
Anion gap: 6 (ref 5–15)
BUN: 7 mg/dL (ref 6–20)
CO2: 25 mmol/L (ref 22–32)
Calcium: 8.9 mg/dL (ref 8.9–10.3)
Chloride: 106 mmol/L (ref 98–111)
Creatinine, Ser: 0.65 mg/dL (ref 0.44–1.00)
GFR, Estimated: >60 mL/min (ref >60)
Glucose, Bld: 95 mg/dL (ref 70–99)
Potassium: 3.3 mmol/L — ABNORMAL LOW (ref 3.5–5.1)
Sodium: 137 mmol/L (ref 135–145)

## 2021-10-04 LAB — CBC WITH DIFFERENTIAL/PLATELET
Abs Immature Granulocytes: 0.03 10*3/uL (ref 0.00–0.07)
Basophils Absolute: 0.1 10*3/uL (ref 0.0–0.1)
Basophils Relative: 1 %
Eosinophils Absolute: 0.2 10*3/uL (ref 0.0–0.5)
Eosinophils Relative: 2 %
HCT: 36.9 % (ref 36.0–46.0)
Hemoglobin: 12.3 g/dL (ref 12.0–15.0)
Immature Granulocytes: 0 %
Lymphocytes Relative: 35 %
Lymphs Abs: 3.7 10*3/uL (ref 0.7–4.0)
MCH: 30.4 pg (ref 26.0–34.0)
MCHC: 33.3 g/dL (ref 30.0–36.0)
MCV: 91.1 fL (ref 80.0–100.0)
Monocytes Absolute: 0.6 10*3/uL (ref 0.1–1.0)
Monocytes Relative: 6 %
Neutro Abs: 5.8 10*3/uL (ref 1.7–7.7)
Neutrophils Relative %: 56 %
Platelets: 441 10*3/uL — ABNORMAL HIGH (ref 150–400)
RBC: 4.05 MIL/uL (ref 3.87–5.11)
RDW: 12.3 % (ref 11.5–15.5)
WBC: 10.4 10*3/uL (ref 4.0–10.5)
nRBC: 0 % (ref 0.0–0.2)

## 2021-10-04 LAB — LACTIC ACID, PLASMA: Lactic Acid, Venous: 1.3 mmol/L (ref 0.5–1.9)

## 2021-10-04 NOTE — ED Provider Triage Note (Signed)
Emergency Medicine Provider Triage Evaluation Note  Kaitlyn Shepherd , a 46 y.o. female  was evaluated in triage.  Pt complains of wound to right right mid tib-fib medial calf.  Thought she was bit by something however did not see anything.  Wound x4 days.  Has noted some purulent discharge with some increasing surrounding erythema.  Also noted over the last few days she has noted a black lesion to her mid calf.  Some chills without documented fever.  Review of Systems  Positive: wound Negative: numbness  Physical Exam  BP 127/86 (BP Location: Right Arm)   Pulse 95   Temp 98.1 F (36.7 C) (Oral)   Resp 19   LMP 01/08/2013 Comment: pt denies pregnancy and states tubal ligation 02-28-13  SpO2 100%  Gen:   Awake, no distress   Resp:  Normal effort  MSK:   Moves extremities without difficulty Skin:  Exquisite tenderness with wound to right medial aspect midshaft tib-fib.  Surrounding induration, erythema.  Central necrotic area. Other:    Medical Decision Making  Medically screening exam initiated at 6:35 PM.  Appropriate orders placed.  Kitty Cadavid was informed that the remainder of the evaluation will be completed by another provider, this initial triage assessment does not replace that evaluation, and the importance of remaining in the ED until their evaluation is complete.  wound   Daisee Centner A, PA-C 10/04/21 1836

## 2021-10-04 NOTE — ED Notes (Signed)
NA x3 vitals 

## 2021-10-04 NOTE — ED Triage Notes (Signed)
Patient here for evaluation of a wound on right foot, patient believes she was bitten by a spider.

## 2021-12-05 ENCOUNTER — Other Ambulatory Visit (HOSPITAL_COMMUNITY): Payer: Self-pay

## 2022-01-17 ENCOUNTER — Other Ambulatory Visit: Payer: Self-pay

## 2022-02-12 ENCOUNTER — Emergency Department (HOSPITAL_BASED_OUTPATIENT_CLINIC_OR_DEPARTMENT_OTHER)
Admission: EM | Admit: 2022-02-12 | Discharge: 2022-02-12 | Disposition: A | Discharge status: Home / Self Care | Payer: Self-pay | Attending: Emergency Medicine | Admitting: Emergency Medicine

## 2022-02-12 ENCOUNTER — Other Ambulatory Visit: Payer: Self-pay

## 2022-02-12 ENCOUNTER — Other Ambulatory Visit (HOSPITAL_BASED_OUTPATIENT_CLINIC_OR_DEPARTMENT_OTHER): Payer: Self-pay

## 2022-02-12 ENCOUNTER — Encounter (HOSPITAL_BASED_OUTPATIENT_CLINIC_OR_DEPARTMENT_OTHER): Payer: Self-pay

## 2022-02-12 ENCOUNTER — Emergency Department (HOSPITAL_BASED_OUTPATIENT_CLINIC_OR_DEPARTMENT_OTHER): Payer: Self-pay

## 2022-02-12 DIAGNOSIS — L03011 Cellulitis of right finger: Secondary | ICD-10-CM | POA: Insufficient documentation

## 2022-02-12 MED ORDER — OXYCODONE-ACETAMINOPHEN 5-325 MG PO TABS
1.0000 | ORAL_TABLET | Freq: Once | ORAL | Status: AC
  Administered 2022-02-12: 1 via ORAL
  Filled 2022-02-12: qty 1

## 2022-02-12 MED ORDER — LIDOCAINE HCL 2 % IJ SOLN
10.0000 mL | Freq: Once | INTRAMUSCULAR | Status: AC
  Administered 2022-02-12: 200 mg
  Filled 2022-02-12: qty 20

## 2022-02-12 MED ORDER — DOXYCYCLINE HYCLATE 100 MG PO TABS
100.0000 mg | ORAL_TABLET | Freq: Once | ORAL | Status: AC
  Administered 2022-02-12: 100 mg via ORAL
  Filled 2022-02-12: qty 1

## 2022-02-12 MED ORDER — DOXYCYCLINE HYCLATE 100 MG PO CAPS
100.0000 mg | ORAL_CAPSULE | Freq: Two times a day (BID) | ORAL | 0 refills | Status: AC
  Filled 2022-02-12: qty 20, 10d supply, fill #0

## 2022-02-12 NOTE — Discharge Instructions (Addendum)
You are seen in the emergency department today for finger/hand pain.  As we discussed the infection that you had at the tip of your finger is consistent with those called a felon, or an infection of the pad of your finger.  This can sometimes be from a break in the skin, and sometimes not.  We have opened and drained it for you and placing you on antibiotics.  I did place 1 stitch in this to help close the area and heal well.  I want the stitches to be removed in about 7 to 10 days.  You can come to the ER to have this done.  You can take over-the-counter pain medicine as needed.  You can use ice as needed for swelling.  Continue to monitor how you're doing and return to the ER for new or worsening symptoms including if the area closes too soon, swelling or redness gets worse, or if you develop fevers.  Have attached the contact information for [facility] for you to follow-up and hopefully establish with a PCP.

## 2022-02-12 NOTE — ED Triage Notes (Signed)
C/o pain and swelling to finger, discoloration noted. Unsure of injury. Pain x 2 weeks

## 2022-02-12 NOTE — ED Provider Notes (Signed)
[facility] [place] Provider Note   CSN: [id number] Arrival date & time: 02/12/22  1239     History  Chief Complaint  Patient presents with   Hand Pain    Kaitlyn Shepherd is a 47 y.o. female with history of migraines, bipolar 1 disorder, anxiety, panic attacks, and seizures who presents emergency department complaining of possible infection to her right middle finger.  Says that pain and swelling has been going on for the past 2 weeks.  She thought she could have gotten a splinter and tried to take this out herself.  No splinter was found and it progressed with swelling and redness.  Now saying that it feels and looks bruised.   Hand Pain       Home Medications Prior to Admission medications   Medication Sig Start Date End Date Taking? Authorizing Provider  doxycycline (VIBRAMYCIN) 100 MG capsule Take 1 capsule (100 mg total) by mouth 2 (two) times daily. 02/12/22  Yes Jaqua Ching T, PA-C  gabapentin (NEURONTIN) 300 MG capsule TAKE 1 CAPSULE BY MOUTH THREE TIMES A DAY 08/16/20   Eulis Canner E, NP  hydrOXYzine (ATARAX) 25 MG tablet Take 1 tablet by mouth every 6 hours as needed for anxiety 04/09/20   Lindell Spar I, NP  hydrOXYzine (ATARAX) 25 MG tablet Take 1 tablet (25 mg total) by mouth every 6 (six) hours as needed for anxiety 07/22/20   Eulis Canner E, NP  hydrOXYzine (ATARAX/VISTARIL) 25 MG tablet Take 1 tablet (25 mg total) by mouth every 6 (six) hours as needed for anxiety. 07/22/20   Salley Slaughter, NP  nicotine (NICODERM CQ - DOSED IN MG/24 HOURS) 21 mg/24hr patch Place 1 patch (21 mg total) onto the skin daily. 07/22/20   Salley Slaughter, NP  QUEtiapine (SEROQUEL) 200 MG tablet Take 1 tablet (200 mg total) by mouth at bedtime. For mood control 07/22/20   Eulis Canner E, NP  QUEtiapine (SEROQUEL) 50 MG tablet Take 1 tablet (50 mg total) by mouth 2 (two) times daily as needed. For agitation 07/22/20   Salley Slaughter, NP  QUEtiapine (SEROQUEL) 50 MG tablet Take 1 tablet by mouth twice daily 03/31/20   Nev[place] Crane, MD  QUEtiapine (SEROQUEL) 50 MG tablet Take 1 tablet (50 mg total) by mouth 2 (two) times daily as needed for agitation 07/22/20   Salley Slaughter, NP  venlafaxine XR (EFFEXOR-XR) 75 MG 24 hr capsule Take 1 capsule (75 mg total) by mouth daily with breakfast. For depression 07/22/20   Salley Slaughter, NP  venlafaxine XR (EFFEXOR-XR) 75 MG 24 hr capsule Take 1 capsule by mouth daily with breakfast for depression 07/22/20   Salley Slaughter, NP      Allergies    Ativan [lorazepam], Buspar [buspirone], Darvocet [propoxyphene n-acetaminophen], Lithium, and Toradol [ketorolac tromethamine]    Review of Systems   Review of Systems  Skin:  Positive for color change.  All other systems reviewed and are negative.   Physical Exam Updated Vital Signs BP 115/81 (BP Location: Left Arm)   Pulse 88   Temp 98.3 F (36.8 C) (Oral)   Resp 16   Ht 5' 4"$  (1.626 m)   Wt 54.4 kg   LMP 01/08/2013 Comment: pt denies pregnancy and states tubal ligation 02-28-13  SpO2 99%   BMI 20.60 kg/m  Physical Exam Vitals and nursing note reviewed.  Constitutional:      Appearance: Normal appearance.  HENT:  Head: Normocephalic and atraumatic.  Eyes:     Conjunctiva/sclera: Conjunctivae normal.  Pulmonary:     Effort: Pulmonary effort is normal. No respiratory distress.  Musculoskeletal:     Comments: Normal ROM of all digits  Skin:    General: Skin is warm and dry.     Comments: Swelling and discoloration noted to the pad of the distal right 3rd digit, no nail involvement  Neurological:     Mental Status: She is alert.  Psychiatric:        Mood and Affect: Mood normal.        Behavior: Behavior normal.     ED Results / Procedures / Treatments   Labs (all labs ordered are listed, but only abnormal results are displayed) Labs Reviewed - No data to  display  EKG None  Radiology DG Finger Middle Right  Result Date: 02/12/2022 CLINICAL DATA:  Swelling and pain post trauma EXAM: RIGHT MIDDLE FINGER 2+V COMPARISON:  None Available. FINDINGS: There is no evidence of fracture or dislocation. There is no evidence of arthropathy or other focal bone abnormality. Soft tissue swelling at the palmar aspect of the distal phalanx. IMPRESSION: Soft tissue swelling without fracture or dislocation. Electronically Signed   By: Lucrezia Europe M.D.   On: 02/12/2022 13:11    Procedures .Marland KitchenIncision and Drainage  Date/Time: 02/12/2022 4:21 PM  Performed by: Kateri Plummer, PA-C Authorized by: Kateri Plummer, PA-C   Consent:    Consent obtained:  Verbal   Consent given by:  Patient   Risks, benefits, and alternatives were discussed: yes     Risks discussed:  Incomplete drainage, pain, infection and bleeding Universal protocol:    Patient identity confirmed:  Provided demographic data Location:    Type:  Abscess   Size:  1   Location:  Upper extremity   Upper extremity location:  Finger   Finger location:  R long finger Pre-procedure details:    Skin preparation:  Povidone-iodine Sedation:    Sedation type:  None Anesthesia:    Anesthesia method:  Nerve block   Block location:  Digital right 3rd digit   Block needle gauge:  25 G   Block anesthetic:  Lidocaine 2% w/o epi   Block technique:  Modified transthecal   Block injection procedure:  Anatomic landmarks palpated   Block outcome:  Anesthesia achieved Procedure type:    Complexity:  Simple Procedure details:    Ultrasound guidance: no     Needle aspiration: no     Incision types:  Single straight   Incision depth:  Subcutaneous   Wound management:  Probed and deloculated   Drainage:  Bloody and purulent   Drainage amount:  Moderate   Wound treatment:  Wound left open   Packing materials:  None Post-procedure details:    Procedure completion:  Tolerated well, no immediate  complications Comments:     1 suture placed in middle of incision due to concern for poor wound healing. Suture with 4-0 nylon, left loose     Medications Ordered in ED Medications  lidocaine (XYLOCAINE) 2 % (with pres) injection 200 mg (200 mg Infiltration Given by Other 02/12/22 1557)  oxyCODONE-acetaminophen (PERCOCET/ROXICET) 5-325 MG per tablet 1 tablet (1 tablet Oral Given 02/12/22 1528)  doxycycline (VIBRA-TABS) tablet 100 mg (100 mg Oral Given 02/12/22 1620)    ED Course/ Medical Decision Making/ A&P  Medical Decision Making Amount and/or Complexity of Data Reviewed Radiology: ordered.  Risk Prescription drug management.  Patient is a 47 y.o. female who presents to the emergency department for an abscess.  Physical exam: Swelling and discoloration of the right 3rd finger pad, consistent with felon  Procedure: Abscess was incised and drained. Area was anesthestized with lidocaine digital block. Patient tolerated procedure well with no immediate complications. 1 midline suture placed to assist with wound closure, left loose to allow for continued drainage.   Disposition: Patient is not requiring admission or inpatient treatment further symptoms.  Patient was placed on course of antibiotics and instructed to have a wound check in 3 days.  We discussed reasons to return to the emergency department, and patient is agreeable to the plan.  Final Clinical Impression(s) / ED Diagnoses Final diagnoses:  Felon of finger of right hand    Rx / DC Orders ED Discharge Orders          Ordered    doxycycline (VIBRAMYCIN) 100 MG capsule  2 times daily        02/12/22 1610           Portions of this report may have been transcribed using voice recognition software. Every effort was made to ensure accuracy; however, inadvertent computerized transcription errors may be present.    Estill Cotta 02/12/22 1629    Cristie Hem,  MD 02/13/22 [phone]

## 2022-02-12 NOTE — ED Notes (Signed)
Moved Pt. To room 2 due to Pt. R middle finger at the tip end is edematous with having a large bulbous tip and purple in color on the tip.  Pt. Does have a small open area that looks like it has had some drainage.  Pt. Has been crying and will  not talk much.  Pt. Said the finger has been like this for several days.

## 2022-07-22 ENCOUNTER — Other Ambulatory Visit: Payer: Self-pay

## 2022-07-22 ENCOUNTER — Emergency Department (HOSPITAL_COMMUNITY)
Admission: EM | Admit: 2022-07-22 | Discharge: 2022-07-22 | Disposition: A | Discharge status: Home / Self Care | Payer: 59 | Attending: Emergency Medicine | Admitting: Emergency Medicine

## 2022-07-22 ENCOUNTER — Encounter (HOSPITAL_COMMUNITY): Payer: Self-pay

## 2022-07-22 DIAGNOSIS — L03317 Cellulitis of buttock: Secondary | ICD-10-CM | POA: Insufficient documentation

## 2022-07-22 DIAGNOSIS — L0291 Cutaneous abscess, unspecified: Secondary | ICD-10-CM

## 2022-07-22 DIAGNOSIS — L0231 Cutaneous abscess of buttock: Secondary | ICD-10-CM | POA: Diagnosis present

## 2022-07-22 MED ORDER — OXYCODONE HCL 5 MG PO TABS: 5.0000 mg | ORAL_TABLET | Freq: Four times a day (QID) | ORAL | 0 refills | Status: AC | PRN

## 2022-07-22 MED ORDER — DOXYCYCLINE HYCLATE 100 MG PO CAPS: 100.0000 mg | ORAL_CAPSULE | Freq: Two times a day (BID) | ORAL | 0 refills | Status: AC

## 2022-07-22 MED ORDER — DOXYCYCLINE HYCLATE 100 MG PO TABS
100.0000 mg | ORAL_TABLET | Freq: Once | ORAL | Status: AC
  Administered 2022-07-22: 100 mg via ORAL
  Filled 2022-07-22: qty 1

## 2022-07-22 MED ORDER — OXYCODONE HCL 5 MG PO TABS
10.0000 mg | ORAL_TABLET | Freq: Once | ORAL | Status: AC
  Administered 2022-07-22: 10 mg via ORAL
  Filled 2022-07-22: qty 2

## 2022-07-22 MED ORDER — LIDOCAINE-EPINEPHRINE-TETRACAINE (LET) TOPICAL GEL
3.0000 mL | Freq: Once | TOPICAL | Status: AC
  Administered 2022-07-22: 3 mL via TOPICAL
  Filled 2022-07-22: qty 3

## 2022-07-22 NOTE — ED Notes (Signed)
I&D performed by Renaye Rakers, MD.

## 2022-07-22 NOTE — ED Provider Notes (Signed)
[place] EMERGENCY DEPARTMENT AT [facility] Provider Note   CSN: [id number] Arrival date & time: 07/22/22  1633     History  Chief Complaint  Patient presents with   Abscess    Kaitlyn Shepherd is a 47 y.o. female presented to ED with complaint for a bug bite on her buttock.  Patient reports that it is feeling about 4 days.  She has a history of an abscess in her lower extremity as well.  She said it was a little bit of drainage from the site.  She denies fevers or chills  HPI     Home Medications Prior to Admission medications   Medication Sig Start Date End Date Taking? Authorizing Provider  doxycycline (VIBRAMYCIN) 100 MG capsule Take 1 capsule (100 mg total) by mouth 2 (two) times daily for 9 days. 07/23/22 08/01/22 Yes Atziri Zubiate, Kermit Balo, MD  oxyCODONE (ROXICODONE) 5 MG immediate release tablet Take 1 tablet (5 mg total) by mouth every 6 (six) hours as needed for up to 10 doses for severe pain. 07/22/22  Yes Maddyson Keil, Kermit Balo, MD  doxycycline (VIBRAMYCIN) 100 MG capsule Take 1 capsule (100 mg total) by mouth 2 (two) times daily. 02/12/22   Roemhildt, Lorin T, PA-C  gabapentin (NEURONTIN) 300 MG capsule TAKE 1 CAPSULE BY MOUTH THREE TIMES A DAY 08/16/20   Toy Cookey E, NP  hydrOXYzine (ATARAX) 25 MG tablet Take 1 tablet by mouth every 6 hours as needed for anxiety 04/09/20   Armandina Stammer I, NP  hydrOXYzine (ATARAX) 25 MG tablet Take 1 tablet (25 mg total) by mouth every 6 (six) hours as needed for anxiety 07/22/20   Toy Cookey E, NP  hydrOXYzine (ATARAX/VISTARIL) 25 MG tablet Take 1 tablet (25 mg total) by mouth every 6 (six) hours as needed for anxiety. 07/22/20   Shanna Cisco, NP  nicotine (NICODERM CQ - DOSED IN MG/24 HOURS) 21 mg/24hr patch Place 1 patch (21 mg total) onto the skin daily. 07/22/20   Shanna Cisco, NP  QUEtiapine (SEROQUEL) 200 MG tablet Take 1 tablet (200 mg total) by mouth at bedtime. For mood control 07/22/20   Toy Cookey  E, NP  QUEtiapine (SEROQUEL) 50 MG tablet Take 1 tablet (50 mg total) by mouth 2 (two) times daily as needed. For agitation 07/22/20   Shanna Cisco, NP  QUEtiapine (SEROQUEL) 50 MG tablet Take 1 tablet by mouth twice daily 03/31/20   Zena Amos, MD  QUEtiapine (SEROQUEL) 50 MG tablet Take 1 tablet (50 mg total) by mouth 2 (two) times daily as needed for agitation 07/22/20   Shanna Cisco, NP  venlafaxine XR (EFFEXOR-XR) 75 MG 24 hr capsule Take 1 capsule (75 mg total) by mouth daily with breakfast. For depression 07/22/20   Shanna Cisco, NP  venlafaxine XR (EFFEXOR-XR) 75 MG 24 hr capsule Take 1 capsule by mouth daily with breakfast for depression 07/22/20   Shanna Cisco, NP      Allergies    Ativan [lorazepam], Buspar [buspirone], Darvocet [propoxyphene n-acetaminophen], Lithium, and Toradol [ketorolac tromethamine]    Review of Systems   Review of Systems  Physical Exam Updated Vital Signs BP 116/85   Pulse (!) 104   Temp 99.2 F (37.3 C) (Oral)   Resp 18   Ht 5\' 4"  (1.626 m)   Wt 54.4 kg   LMP 01/08/2013 Comment: pt denies pregnancy and states tubal ligation 02-28-13  SpO2 98%   BMI 20.59 kg/m  Physical Exam  Constitutional:      General: She is not in acute distress. HENT:     Head: Normocephalic and atraumatic.  Eyes:     Conjunctiva/sclera: Conjunctivae normal.     Pupils: Pupils are equal, round, and reactive to light.  Cardiovascular:     Rate and Rhythm: Normal rate and regular rhythm.  Pulmonary:     Effort: Pulmonary effort is normal. No respiratory distress.  Abdominal:     General: There is no distension.     Tenderness: There is no abdominal tenderness.  Musculoskeletal:     Comments: Large firm and indurated region involving the right buttock, with some small purulent spontaneous expressed  Skin:    General: Skin is warm and dry.  Neurological:     General: No focal deficit present.     Mental Status: She is alert. Mental status is  at baseline.  Psychiatric:        Mood and Affect: Mood normal.        Behavior: Behavior normal.     ED Results / Procedures / Treatments   Labs (all labs ordered are listed, but only abnormal results are displayed) Labs Reviewed - No data to display  EKG None  Radiology No results found.  Procedures .Marland KitchenIncision and Drainage  Date/Time: 07/22/2022 8:21 PM  Performed by: Terald Sleeper, MD Authorized by: Terald Sleeper, MD   Consent:    Consent obtained:  Verbal   Consent given by:  Parent   Risks discussed:  Bleeding, incomplete drainage, infection, pain and damage to other organs Universal protocol:    Procedure explained and questions answered to patient or proxy's satisfaction: yes     Relevant documents present and verified: yes     Test results available : yes     Imaging studies available: yes     Required blood products, implants, devices, and special equipment available: yes     Site/side marked: yes     Immediately prior to procedure, a time out was called: yes     Patient identity confirmed:  Arm band Location:    Type:  Abscess   Size:  3 cm   Location:  Anogenital   Anogenital location: right buttock. Pre-procedure details:    Skin preparation:  Povidone-iodine Sedation:    Sedation type:  Anxiolysis Anesthesia:    Anesthesia method:  Topical application   Topical anesthetic:  LET Procedure type:    Complexity:  Simple Procedure details:    Ultrasound guidance: yes     Incision types:  Single straight   Wound management:  Probed and deloculated   Drainage:  Purulent   Drainage amount:  Copious   Wound treatment:  Wound left open   Packing materials:  None Post-procedure details:    Procedure completion:  Tolerated well, no immediate complications     Medications Ordered in ED Medications  oxyCODONE (Oxy IR/ROXICODONE) immediate release tablet 10 mg (10 mg Oral Given 07/22/22 1929)  doxycycline (VIBRA-TABS) tablet 100 mg (100 mg Oral  Given 07/22/22 1929)  lidocaine-EPINEPHrine-tetracaine (LET) topical gel (3 mLs Topical Given 07/22/22 1930)    ED Course/ Medical Decision Making/ A&P                             Medical Decision Making Risk Prescription drug management.   Patient is here with concern for cellulitis and an underlying abscess of the right buttock.  This may have been due  to an insect bite.  Bedside ultrasound shows a superficial fluid collection that does extend down approximately 3 cm under the surface of the skin.  Patient was consented for I&D and in agreement.  She was given oral pain medications prior to this as well as topical anesthetic.  We were able to irrigate the wound, performed I&D, expressed a copious amount of pus.  Patient would not tolerate packing placement, therefore the wound was left open with an incision large enough to hopefully continue draining.  Advised warm bath soak tonight to ensure continued drainage. Patient was started on doxycycline for concern for overlying cellulitis.  Close return precautions were discussed.  She verbalized understanding        Final Clinical Impression(s) / ED Diagnoses Final diagnoses:  None    Rx / DC Orders ED Discharge Orders          Ordered    doxycycline (VIBRAMYCIN) 100 MG capsule  2 times daily        07/22/22 2020    oxyCODONE (ROXICODONE) 5 MG immediate release tablet  Every 6 hours PRN        07/22/22 2020              Terald Sleeper, MD 07/22/22 2022

## 2022-07-22 NOTE — ED Triage Notes (Signed)
Pt has a large abscess on right buttocksx4. The abscess is approx the size of a gulf ball. The skin surrounding the abscess is erythematous. The abscess is opened and pt states it has been draining.
